# Patient Record
Sex: Female | Born: 1985 | Race: Black or African American | Hispanic: No | Marital: Single | Smoking: Never smoker
Health system: Southern US, Community
[De-identification: ages and names within clinical notes are randomized; demographics above are authoritative.]

## PROBLEM LIST (undated history)

## (undated) DIAGNOSIS — S025XXA Fracture of tooth (traumatic), initial encounter for closed fracture: Secondary | ICD-10-CM

## (undated) DIAGNOSIS — D649 Anemia, unspecified: Secondary | ICD-10-CM

## (undated) HISTORY — PX: THERAPEUTIC ABORTION: SHX798

## (undated) HISTORY — PX: INDUCED ABORTION: SHX677

---
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
## 2008-10-09 ENCOUNTER — Inpatient Hospital Stay (HOSPITAL_COMMUNITY): Admission: EM | Admit: 2008-10-09 | Discharge: 2008-10-10 | Payer: Self-pay | Admitting: Emergency Medicine

## 2009-11-23 ENCOUNTER — Emergency Department (HOSPITAL_COMMUNITY): Admission: EM | Admit: 2009-11-23 | Discharge: 2009-11-23 | Payer: Self-pay | Admitting: Family Medicine

## 2010-01-09 ENCOUNTER — Emergency Department (HOSPITAL_COMMUNITY): Admission: EM | Admit: 2010-01-09 | Discharge: 2010-01-09 | Payer: Self-pay | Admitting: Family Medicine

## 2011-02-06 LAB — POCT URINALYSIS DIP (DEVICE)
Bilirubin Urine: NEGATIVE
Glucose, UA: NEGATIVE mg/dL
Hgb urine dipstick: NEGATIVE
Ketones, ur: NEGATIVE mg/dL
Nitrite: NEGATIVE
Protein, ur: NEGATIVE mg/dL
Specific Gravity, Urine: 1.02 (ref 1.005–1.030)
Urobilinogen, UA: 0.2 mg/dL (ref 0.0–1.0)
pH: 6 (ref 5.0–8.0)

## 2011-02-06 LAB — WET PREP, GENITAL
Trich, Wet Prep: NONE SEEN
Yeast Wet Prep HPF POC: NONE SEEN

## 2011-02-06 LAB — POCT PREGNANCY, URINE: Preg Test, Ur: POSITIVE

## 2011-02-06 LAB — GC/CHLAMYDIA PROBE AMP, GENITAL
Chlamydia, DNA Probe: NEGATIVE
GC Probe Amp, Genital: NEGATIVE

## 2011-04-05 NOTE — Consult Note (Signed)
NAME:  Maria Bowman, Maria Bowman                  ACCOUNT NO.:  [id number]   MEDICAL RECORD NO.:  [id number]          PATIENT TYPE:  INP   LOCATION:  1520                         FACILITY:  [facility]   PHYSICIAN:  Antonietta Breach, M.D.  DATE OF BIRTH:  1986/02/27   DATE OF CONSULTATION:  10/10/2008  DATE OF DISCHARGE:  10/10/2008                                 CONSULTATION   REQUESTING PHYSICIAN:  [facility].   REASON FOR CONSULTATION:  Overdose.   HISTORY OF PRESENT ILLNESS:  Maria Bowman is a 25 year old female admitted  to the [facility] on October 09, 2008 after a Benadryl  overdose in a suicide attempt.   The patient states that she was drinking a lot of vodka and became  severely intoxicated.  She was concerned about gossip in the family  reaching her child.  She realizes that she over reacted.  When she came  into the emergency room she did voice that she was having suicidal  thoughts.  The patient denied any suicidal intent.  She has a lot of  hope and constructive future goals.  She points out her role as a  mother.   The patient does not have any difficulty with memory and orientation.  She is socially appropriate and cooperative.  She does realize that she  has been abusing substances.  Her urine drug screen is positive for THC.   The patient emphasizes that she drank the alcohol and became very  intoxicated prior to the overdose.   PAST PSYCHIATRIC HISTORY:  The patient does have a history of overdosing  when she was 25 years old.  She states at that point she was trying to  get her mother's attention.   FAMILY PSYCHIATRIC HISTORY:  None known.   SOCIAL HISTORY:  The patient has two children.  She has been using  marijuana.  She states that the father of the baby does help her  regularly.   PAST MEDICAL HISTORY:  Status post Benadryl intoxication as well as  alcohol intoxication.   MEDICATIONS:  Zofran p.r.n.  She has NO KNOWN DRUG ALLERGIES.   LABORATORY DATA:   SGOT 17, SGPT 15.  Urine drug screen was positive for  THC.   REVIEW OF SYSTEMS:  Noncontributory.   EXAMINATION:  VITAL SIGNS:  Temperature 98.2, pulse 82, respiratory rate  20, blood pressure 108/62, O2 saturation on room air 100%.   MENTAL STATUS EXAM:  Maria Bowman is alert.  Her attention span is normal.  Her concentration is normal.  Her eye contact is good.  Her affect is  broad and appropriate.  Mood is within normal limits.  She is oriented  to all spheres.  Her memory is intact to immediate, recent and remote.  Her speech involves normal rate and prosody with no dysarthria.  Thought  process is logical, coherent, goal-directed.  No looseness of  associations.  Thought content:  No thoughts of harming herself, no  thoughts of harming others, no delusions, no hallucinations.  Insight is  intact.  Judgment is intact.   ASSESSMENT:  Axis  I:  Adjustment disorder with mixed disturbance of  emotions and conduct, now resolved.  Rule out polysubstance dependence.  Axis II:  Deferred.  Axis III:  See past medical history.  Axis IV:  Primary support group.  Axis V:  Currently 54 after read recovering from intoxication.   Ms. Frede is no longer at risk to harm herself or others.  She agrees to  call emergency services immediately for any thoughts of harming herself,  thoughts of harming others or distress.   The undersigned provided ego support and education.   The undersigned recommended that the patient immediately enter an  inpatient chemical dependency substance rehabilitation program.  However, the patient declined and she is no longer committable after  recovering from her intoxication.  She declines any form of chemical  dependent rehabilitation.  However, if she changes her mind would call  [phone] for access to chemical dependency treatment.   The social worker can provide the patient an outpatient psychiatric  followup after discharge to provide additional reinforcement  to the  patient that she could greatly benefit from formal chemical abuse  relapse prevention skills.  She could also benefit from coping skills  and stress management training with a counselor.  Outpatient psychiatric  followup can be obtained at one of the clinics attached to [facility], [place] or [facility].      Antonietta Breach, M.D.  Electronically Signed     JW/MEDQ  D:  11/02/2008  T:  11/02/2008  Job:  [id number]

## 2011-04-05 NOTE — H&P (Signed)
NAME:  Maria Bowman, Maria Bowman NO.:  [id number]   MEDICAL RECORD NO.:  [id number]          PATIENT TYPE:  INP   LOCATION:  0107                         FACILITY:  [facility]   PHYSICIAN:  Lamar Laundry, MD      DATE OF BIRTH:  [birth year]   DATE OF ADMISSION:  10/09/2008  DATE OF DISCHARGE:                              HISTORY & PHYSICAL   CHIEF COMPLAINT:  Sleeping pill overdose.   HISTORY OF PRESENT ILLNESS:  A 25 year old female with a history of a  prior suicide attempt at age 51, who presents to the ER today after  taking 21 over-the-counter sleeping pills, whose active ingredient was  25 mg of Benadryl; and drinking some vodka and subsequently calling her  best friend to let her know what she had done.  Her best friend came to  her house and noted that she was awake and breathing spontaneously, but  decided to bring her to the emergency room for further evaluation.   Upon arrival, the patient was breathing spontaneously and per the ER  report continued to threaten to kill herself.  Charcoal was administered  as the pills were ingested recently.  The patient vomited, but no pill  fragments came up.  She continued to be alert and breathe spontaneously  while she was in the ER.  She admits that she was in fact trying to hurt  herself and take her own life by doing this.  She does not see anyone  from [facility] or psychiatry on an outpatient basis.   ALLERGIES:  No known drug allergies.   MEDICATIONS:  No medications.   PAST MEDICAL HISTORY:  Previous suicide attempt at age 25, otherwise  negative.   SOCIAL HISTORY:  She is single and has 2 children.  She works as a host  at [employer].  She has smoked marijuana for the last 10 years, but  she states that she has quit this as of 3 days ago.  She states that she  is a social drinker, drinking 4-6 drinks per week.  She denies occasions  where she drinks more than this.  She denies the use of tobacco.   FAMILY  HISTORY:  She denies any history of premature coronary artery  disease or mental health issues in her family.   REVIEW OF SYSTEMS:  A 10-point review of systems was performed and is  negative.  Other than the significant frustration that she has felt all  day today, leading up to her overdose.   PHYSICAL EXAMINATION:  VITAL SIGNS:  Temperature 98.2, blood pressure  128/80, pulse 100, respirations 18, saturations 100% on  room air.  GENERAL:  This is a young [place] female in no acute distress.  HEENT:  Normocephalic and atraumatic.  Extraocular movements intact.  Moist mucous membranes.  NECK:  No JVD.  LUNGS:  Clear to auscultation bilaterally.  No crackles, wheezes or  rhonchi.  CARDIOVASCULAR:  Tachycardic.  Regular rhythm.  No murmurs, rubs or  gallops.  ABDOMEN:  Soft, nontender and nondistended.  Normal active bowel sounds.  EXTREMITIES:  No pedal edema.  SKIN:  No bruises.  NEUROLOGIC:  Awake, alert and oriented x3.  No focal motor or sensory  deficits.   LABS:  Chem Panel:  Sodium 143, potassium 2.7, chloride 109, bicarb 24,  BUN 9, creatinine 0.8, glucose 63.  Calcium 9.5, albumin 4.3.  LFTs  within normal limits.  Pregnancy test was negative.  Alcohol level was  elevated at 16.  Tylenol level was less than 10.  Urinalysis was  negative.  Chest x-ray does not reveal any acute findings.  An EKG  reveals the patient to be in normal sinus rhythm without any ST or T-  wave abnormalities; with a prolonged QT interval (the corrected QT  interval being 487 mLs).   ASSESSMENT AND PLAN:  1. This is a 25 year old with a sleeping pill overdose in a suicide      attempt.  We will admit the patient to the hospital.  Monitor her      on telemetry and check and EKG in the a.m.  We will provide her      with gentle IV fluids.  We will have a sitter at all times.      Behavioral Health and psychiatry will see the patient in the      morning.  We will also check a urine toxicity  screen, as this was      not yet completed.  2. Hypokalemia:  This is likely secondary to the emesis induced by      charcoal.  We will provide the patient with oral potassium and      potassium in her IV fluids.  Will recheck the values in the      morning.  3. Prolonged QT interval:  We will keep the patient on telemetry and      recheck an EKG in the morning.  4. Prophylaxis:  We will place the patient on SCDs.      Lamar Laundry, MD  Electronically Signed     HR/MEDQ  D:  10/09/2008  T:  10/09/2008  Job:  [id number]

## 2011-04-08 NOTE — Discharge Summary (Signed)
NAME:  Maria Bowman, Maria Bowman                  ACCOUNT NO.:  [id number]   MEDICAL RECORD NO.:  [id number]          PATIENT TYPE:  INP   LOCATION:  1520                         FACILITY:  [facility]   PHYSICIAN:  Hillery Aldo, M.D.   DATE OF BIRTH:  May 11, 1986   DATE OF ADMISSION:  10/09/2008  DATE OF DISCHARGE:  10/10/2008                               DISCHARGE SUMMARY   PRIMARY CARE PHYSICIAN:  Unassigned.   DISCHARGE DIAGNOSES:  1. Benadryl overdose.  2. Suicide attempt.  3. Cannabis abuse.  4. Hypokalemia.  5. Normocytic anemia.  6. Alcohol abuse.  7. Polysubstance abuse.  8. Adjustment reaction not otherwise specified.   DISCHARGE MEDICATIONS:  None.   CONSULTATIONS:  Dr. Jeanie Sewer of psychiatry.   BRIEF ADMISSION HISTORY:  The patient is a 25 year old female who  presented to the hospital after ingesting 21 Benadryl tablets, drinking  vodka, and then subsequently calling her best friend to inform her of  her intentional overdose.  She was brought to the emergency department  accompanied by her friend for further evaluation.  For the full details,  please see the dictated report done by Dr. Ancil Boozer.   PROCEDURES AND DIAGNOSTIC STUDIES:  Chest x-ray on October 09, 2008  showed no acute abnormalities.   DISCHARGE LABORATORY VALUES:  Magnesium was 2.3.  Sodium was 141,  potassium 3.6, chloride 114, bicarb 24, BUN 10, creatinine 0.1, glucose  85.  White blood cell count was 5.1, hemoglobin 10.8, hematocrit 31.8,  platelets 182.  Urine drug screen was positive for tetrahydrocannabinol.   HOSPITAL COURSE BY PROBLEM:  1. Intentional overdose/suicide attempt:  The patient was admitted and      monitored closely for hemodynamic stability.  She was seen in      consultation with Dr. Jeanie Sewer of psychiatry, and was not felt to      be committable.  She declined inpatient psychiatric hospitalization      or aftercare for chemical dependency.  Nevertheless, the patient      was encouraged to  follow up with [facility] or      [facility].  The telephone numbers were provided      for the patient at discharge.  2. Hypokalemia:  The patient was appropriately repleted.  3. Mild normocytic anemia likely due to menstrual cycle.  No further      diagnostic workup was undertaken.  4. Polysubstance abuse:  The patient was counseled on the importance      of cessation, but declined further chemical dependency treatment.   DISPOSITION:  The patient was discharged home after being cleared by the  psychiatrist.  Again, she was instructed to follow up with mental health  services.   Time spent coordinating care for discharge and discharge instructions  equals 35 minutes.      Hillery Aldo, M.D.  Electronically Signed     CR/MEDQ  D:  10/24/2008  T:  10/24/2008  Job:  [id number]

## 2011-08-24 LAB — CBC
HCT: 37.1
Hemoglobin: 10.8 — ABNORMAL LOW
MCHC: 32.6
MCV: 85.5
RBC: 3.72 — ABNORMAL LOW
RBC: 4.34
RDW: 13.5
WBC: 5.1
WBC: 7

## 2011-08-24 LAB — RAPID URINE DRUG SCREEN, HOSP PERFORMED
Amphetamines: NOT DETECTED
Benzodiazepines: NOT DETECTED
Tetrahydrocannabinol: POSITIVE — AB

## 2011-08-24 LAB — COMPREHENSIVE METABOLIC PANEL
ALT: 15
AST: 17
Albumin: 4.3
CO2: 24
Chloride: 109
Creatinine, Ser: 0.84
GFR calc Af Amer: >60
GFR calc non Af Amer: >60
Sodium: 143
Total Bilirubin: 0.7

## 2011-08-24 LAB — URINALYSIS, ROUTINE W REFLEX MICROSCOPIC
Bilirubin Urine: NEGATIVE
Hgb urine dipstick: NEGATIVE
Ketones, ur: NEGATIVE
Specific Gravity, Urine: 1.006
Urobilinogen, UA: 1
pH: 6.5

## 2011-08-24 LAB — ETHANOL: Alcohol, Ethyl (B): 16 — ABNORMAL HIGH

## 2011-08-24 LAB — BASIC METABOLIC PANEL
Calcium: 8.8
GFR calc Af Amer: >60
GFR calc non Af Amer: >60
Potassium: 3.6
Sodium: 141

## 2013-11-25 ENCOUNTER — Encounter (HOSPITAL_COMMUNITY): Payer: Self-pay | Admitting: Emergency Medicine

## 2013-11-25 ENCOUNTER — Emergency Department (HOSPITAL_COMMUNITY)
Admission: EM | Admit: 2013-11-25 | Discharge: 2013-11-25 | Disposition: A | Discharge status: Home / Self Care | Payer: Medicaid Other | Source: Home / Self Care | Attending: Emergency Medicine | Admitting: Emergency Medicine

## 2013-11-25 DIAGNOSIS — N3000 Acute cystitis without hematuria: Secondary | ICD-10-CM

## 2013-11-25 LAB — POCT URINALYSIS DIP (DEVICE)
Bilirubin Urine: NEGATIVE
Glucose, UA: NEGATIVE mg/dL
Ketones, ur: 15 mg/dL — AB
NITRITE: POSITIVE — AB
PH: 6 (ref 5.0–8.0)
Specific Gravity, Urine: >1.03 — ABNORMAL HIGH (ref 1.005–1.030)
Urobilinogen, UA: 0.2 mg/dL (ref 0.0–1.0)

## 2013-11-25 LAB — POCT PREGNANCY, URINE: Preg Test, Ur: NEGATIVE

## 2013-11-25 MED ORDER — CEPHALEXIN 500 MG PO CAPS: 500.0000 mg | ORAL_CAPSULE | Freq: Three times a day (TID) | ORAL | Status: DC

## 2013-11-25 MED ORDER — PHENAZOPYRIDINE HCL 200 MG PO TABS: 200.0000 mg | ORAL_TABLET | Freq: Three times a day (TID) | ORAL | Status: DC | PRN

## 2013-11-25 NOTE — Discharge Instructions (Signed)
Urinary Tract Infection °Urinary tract infections (UTIs) can develop anywhere along your urinary tract. Your urinary tract is your body's drainage system for removing wastes and extra water. Your urinary tract includes two kidneys, two ureters, a bladder, and a urethra. Your kidneys are a pair of bean-shaped organs. Each kidney is about the size of your fist. They are located below your ribs, one on each side of your spine. °CAUSES °Infections are caused by microbes, which are microscopic organisms, including fungi, viruses, and bacteria. These organisms are so small that they can only be seen through a microscope. Bacteria are the microbes that most commonly cause UTIs. °SYMPTOMS  °Symptoms of UTIs may vary by age and gender of the patient and by the location of the infection. Symptoms in young women typically include a frequent and intense urge to urinate and a painful, burning feeling in the bladder or urethra during urination. Older women and men are more likely to be tired, shaky, and weak and have muscle aches and abdominal pain. A fever may mean the infection is in your kidneys. Other symptoms of a kidney infection include pain in your back or sides below the ribs, nausea, and vomiting. °DIAGNOSIS °To diagnose a UTI, your caregiver will ask you about your symptoms. Your caregiver also will ask to provide a urine sample. The urine sample will be tested for bacteria and white blood cells. White blood cells are made by your body to help fight infection. °TREATMENT  °Typically, UTIs can be treated with medication. Because most UTIs are caused by a bacterial infection, they usually can be treated with the use of antibiotics. The choice of antibiotic and length of treatment depend on your symptoms and the type of bacteria causing your infection. °HOME CARE INSTRUCTIONS °· If you were prescribed antibiotics, take them exactly as your caregiver instructs you. Finish the medication even if you feel better after you  have only taken some of the medication. °· Drink enough water and fluids to keep your urine clear or pale yellow. °· Avoid caffeine, tea, and carbonated beverages. They tend to irritate your bladder. °· Empty your bladder often. Avoid holding urine for long periods of time. °· Empty your bladder before and after sexual intercourse. °· After a bowel movement, women should cleanse from front to back. Use each tissue only once. °SEEK MEDICAL CARE IF:  °· You have back pain. °· You develop a fever. °· Your symptoms do not begin to resolve within 3 days. °SEEK IMMEDIATE MEDICAL CARE IF:  °· You have severe back pain or lower abdominal pain. °· You develop chills. °· You have nausea or vomiting. °· You have continued burning or discomfort with urination. °MAKE SURE YOU:  °· Understand these instructions. °· Will watch your condition. °· Will get help right away if you are not doing well or get worse. °Document Released: 08/17/2005 Document Revised: 05/08/2012 Document Reviewed: 12/16/2011 °ExitCare® Patient Information ©2014 [employer]. ° °To restore the normal balance of "good bacteria" in your system.  Take a probiotic once daily.  These can be gotten over the counter at the drug store without a prescription and come under various brand names such as Culturelle, Align, Florastore, and Phillips.  The best thing to do is to ask your pharmacist to recommend a good probiotic that is not too expensive.  ° °

## 2013-11-25 NOTE — ED Provider Notes (Signed)
Chief Complaint:   Chief Complaint  Patient presents with  . Abdominal Pain    History of Present Illness:   Maria Bowman is a 28 year old female who has a two-day history of dysuria, frequency, and malodorous urine. She's had slight suprapubic discomfort. She denies fever, chills, nausea, vomiting, lower back or CVA pain, upper abdominal pain, hematuria, or GYN complaints. She has never had a urinary tract infection before. She cannot think of any obvious precipitating factor.  Review of Systems:  Other than noted above, the patient denies any of the following symptoms: General:  No fevers, chills, sweats, aches, or fatigue. GI:  No abdominal pain, back pain, nausea, vomiting, diarrhea, or constipation. GU:  No dysuria, frequency, urgency, hematuria, or incontinence. GYN:  No discharge, itching, vulvar pain or lesions, pelvic pain, or abnormal vaginal bleeding.  PMFSH:  Past medical history, family history, social history, meds, and allergies were reviewed.   Physical Exam:   Vital signs:  BP 122/79  Pulse 79  Temp(Src) 99.2 F (37.3 C) (Oral)  Resp 16  SpO2 100% Gen:  Alert, oriented, in no distress. Lungs:  Clear to auscultation, no wheezes, rales or rhonchi. Heart:  Regular rhythm, no gallop or murmer. Abdomen:  Flat and soft. There was slight suprapubic pain to palpation.  No guarding, or rebound.  No hepato-splenomegaly or mass.  Bowel sounds were normally active.  No hernia. Back:  No CVA tenderness.  Skin:  Clear, warm and dry.  Labs:    Results for orders placed during the hospital encounter of 11/25/13  POCT PREGNANCY, URINE      Result Value Range   Preg Test, Ur NEGATIVE  NEGATIVE  POCT URINALYSIS DIP (DEVICE)      Result Value Range   Glucose, UA NEGATIVE  NEGATIVE mg/dL   Bilirubin Urine NEGATIVE  NEGATIVE   Ketones, ur 15 (*) NEGATIVE mg/dL   Specific Gravity, Urine >1.030 (*) 1.005 - 1.030   Hgb urine dipstick LARGE (*) NEGATIVE   pH 6.0  5.0 - 8.0   Protein, ur >300 (*) NEGATIVE mg/dL   Urobilinogen, UA 0.2  0.0 - 1.0 mg/dL   Nitrite POSITIVE (*) NEGATIVE   Leukocytes, UA TRACE (*) NEGATIVE     A urine culture was obtained.  Results are pending at this time and we will call about any positive results.  Assessment: The encounter diagnosis was Acute cystitis.   No evidence of pyelonephritis.  Plan:   1.  Meds:  The following meds were prescribed:   Discharge Medication List as of 11/25/2013  1:10 PM    START taking these medications   Details  cephALEXin (KEFLEX) 500 MG capsule Take 1 capsule (500 mg total) by mouth 3 (three) times daily., Starting 11/25/2013, Until Discontinued, Normal    phenazopyridine (PYRIDIUM) 200 MG tablet Take 1 tablet (200 mg total) by mouth 3 (three) times daily as needed for pain., Starting 11/25/2013, Until Discontinued, Normal        2.  Patient Education/Counseling:  The patient was given appropriate handouts, self care instructions, and instructed in symptomatic relief. The patient was told to avoid intercourse for 10 days, get extra fluids, and return for a follow up with her primary care doctor at the completion of treatment for a repeat UA and culture.   3.  Follow up:  The patient was told to follow up if no better in 3 to 4 days, if becoming worse in any way, and given some red flag symptoms  such as fever, back pain, or persistent vomiting which would prompt immediate return.  Follow up here or at the emergency room as needed.     Reuben Likes, MD 11/25/13 [phone]

## 2013-11-25 NOTE — ED Notes (Signed)
Onset lower abdominal area pain last PM. Pain reportedly worse at end of UA stream; LMP onset yesterday PM

## 2015-05-02 ENCOUNTER — Encounter (HOSPITAL_COMMUNITY): Payer: Self-pay | Admitting: Emergency Medicine

## 2015-05-02 ENCOUNTER — Emergency Department (HOSPITAL_COMMUNITY)
Admission: EM | Admit: 2015-05-02 | Discharge: 2015-05-02 | Disposition: A | Discharge status: Home / Self Care | Payer: Medicaid Other | Attending: Emergency Medicine | Admitting: Emergency Medicine

## 2015-05-02 DIAGNOSIS — Y998 Other external cause status: Secondary | ICD-10-CM | POA: Diagnosis not present

## 2015-05-02 DIAGNOSIS — Z792 Long term (current) use of antibiotics: Secondary | ICD-10-CM | POA: Diagnosis not present

## 2015-05-02 DIAGNOSIS — Z23 Encounter for immunization: Secondary | ICD-10-CM | POA: Insufficient documentation

## 2015-05-02 DIAGNOSIS — Y929 Unspecified place or not applicable: Secondary | ICD-10-CM | POA: Insufficient documentation

## 2015-05-02 DIAGNOSIS — S91332A Puncture wound without foreign body, left foot, initial encounter: Secondary | ICD-10-CM | POA: Diagnosis not present

## 2015-05-02 DIAGNOSIS — S99922A Unspecified injury of left foot, initial encounter: Secondary | ICD-10-CM | POA: Diagnosis present

## 2015-05-02 DIAGNOSIS — Y288XXA Contact with other sharp object, undetermined intent, initial encounter: Secondary | ICD-10-CM | POA: Insufficient documentation

## 2015-05-02 DIAGNOSIS — Y9389 Activity, other specified: Secondary | ICD-10-CM | POA: Diagnosis not present

## 2015-05-02 MED ORDER — CIPROFLOXACIN HCL 500 MG PO TABS: 500.0000 mg | ORAL_TABLET | Freq: Two times a day (BID) | ORAL | Status: DC

## 2015-05-02 MED ORDER — CIPROFLOXACIN HCL 500 MG PO TABS
500.0000 mg | ORAL_TABLET | Freq: Once | ORAL | Status: AC
  Administered 2015-05-02: 500 mg via ORAL
  Filled 2015-05-02: qty 1

## 2015-05-02 MED ORDER — TETANUS-DIPHTH-ACELL PERTUSSIS 5-2.5-18.5 LF-MCG/0.5 IM SUSP
0.5000 mL | Freq: Once | INTRAMUSCULAR | Status: AC
  Administered 2015-05-02: 0.5 mL via INTRAMUSCULAR
  Filled 2015-05-02: qty 0.5

## 2015-05-02 NOTE — ED Provider Notes (Signed)
CSN: [id number]     Arrival date & time 05/02/15  2033 History  This chart was scribed for non-physician practitioner, Elpidio Anis, PA-C,working with Nelva Nay, MD, by Karle Plumber, ED Scribe. This patient was seen in room TR11C/TR11C and the patient's care was started at 9:14 PM.  Chief Complaint  Patient presents with  . Foot Injury    The patient said she stepped on a rusty piece of metal and it punctured the bottom of her left foot.    The history is provided by the patient and medical records. No language interpreter was used.    HPI Comments:  Maria Bowman is a 29 y.o. female who presents to the Emergency Department complaining of a puncture wound to the plantar surface of the left foot that occurred approximately 5 hours ago. She states she stepped on a rusty, metal object, possibly a nail, while wearing a shoe. The object penetrated through the shoe into the skin. She reports taking Ibuprofen prior to arrival. Walking on the foot makes the pain worse. Denies alleviating factors. Denies numbness, tingling or weakness of the left foot, fever, chills, nausea, vomiting, warmth, drainage or red streaking.  History reviewed. No pertinent past medical history. History reviewed. No pertinent past surgical history. History reviewed. No pertinent family history. History  Substance Use Topics  . Smoking status: Never Smoker   . Smokeless tobacco: Not on file  . Alcohol Use: Yes   OB History    No data available     Review of Systems  Constitutional: Negative for fever and chills.  Gastrointestinal: Negative for nausea and vomiting.  Skin: Positive for wound.  Neurological: Negative for weakness and numbness.    Allergies  Review of patient's allergies indicates not on file.  Home Medications   Prior to Admission medications   Medication Sig Start Date End Date Taking? Authorizing Provider  cephALEXin (KEFLEX) 500 MG capsule Take 1 capsule (500 mg total) by mouth 3  (three) times daily. 11/25/13   Reuben Likes, MD  phenazopyridine (PYRIDIUM) 200 MG tablet Take 1 tablet (200 mg total) by mouth 3 (three) times daily as needed for pain. 11/25/13   Reuben Likes, MD   Triage Vitals: BP 107/67 mmHg  Pulse 91  Temp(Src) 98.7 F (37.1 C) (Oral)  Resp 16  SpO2 98%  LMP 05/02/2015 (LMP Unknown) Physical Exam  Constitutional: She is oriented to person, place, and time. She appears well-developed and well-nourished.  HENT:  Head: Normocephalic and atraumatic.  Eyes: EOM are normal.  Neck: Normal range of motion.  Cardiovascular: Normal rate.   Pulmonary/Chest: Effort normal.  Musculoskeletal: Normal range of motion.  Neurological: She is alert and oriented to person, place, and time.  Skin: Skin is warm and dry.  Left foot with small puncture wound to plantar surface with surrounding swelling. No erythema. No palpable foreign body.  Psychiatric: She has a normal mood and affect. Her behavior is normal.  Nursing note and vitals reviewed.   ED Course  Procedures (including critical care time) DIAGNOSTIC STUDIES: Oxygen Saturation is 98% on RA, normal by my interpretation.   COORDINATION OF CARE: 9:17 PM- Will update tetanus vaccination and prescribe antibiotics. Pt verbalizes understanding and agrees to plan.  Medications - No data to display  Labs Review Labs Reviewed - No data to display  Imaging Review No results found.   EKG Interpretation None      MDM   Final diagnoses:  None    1.  Puncture wound, left foot  No suspected retained FB. Antibiotics started, wound care instructions provided.  I personally performed the services described in this documentation, which was scribed in my presence. The recorded information has been reviewed and is accurate.    Elpidio Anis, PA-C 05/02/15 2208  Nelva Nay, MD 05/02/15 2252

## 2015-05-02 NOTE — Discharge Instructions (Signed)
Puncture Wound °A puncture wound is an injury that extends through all layers of the skin and into the tissue beneath the skin (subcutaneous tissue). Puncture wounds become infected easily because germs often enter the body and go beneath the skin during the injury. Having a deep wound with a small entrance point makes it difficult for your caregiver to adequately clean the wound. This is especially true if you have stepped on a nail and it has passed through a dirty shoe or other situations where the wound is obviously contaminated. °CAUSES  °Many puncture wounds involve glass, nails, splinters, fish hooks, or other objects that enter the skin (foreign bodies). A puncture wound may also be caused by a human bite or animal bite. °DIAGNOSIS  °A puncture wound is usually diagnosed by your history and a physical exam. You may need to have an X-ray or an ultrasound to check for any foreign bodies still in the wound. °TREATMENT  °· Your caregiver will clean the wound as thoroughly as possible. Depending on the location of the wound, a bandage (dressing) may be applied. °· Your caregiver might prescribe antibiotic medicines. °· You may need a follow-up visit to check on your wound. Follow all instructions as directed by your caregiver. °HOME CARE INSTRUCTIONS  °· Change your dressing once per day, or as directed by your caregiver. If the dressing sticks, it may be removed by soaking the area in water. °· If your caregiver has given you follow-up instructions, it is very important that you return for a follow-up appointment. Not following up as directed could result in a chronic or permanent injury, pain, and disability. °· Only take over-the-counter or prescription medicines for pain, discomfort, or fever as directed by your caregiver. °· If you are given antibiotics, take them as directed. Finish them even if you start to feel better. °You may need a tetanus shot if: °· You cannot remember when you had your last tetanus  shot. °· You have never had a tetanus shot. °If you got a tetanus shot, your arm may swell, get red, and feel warm to the touch. This is common and not a problem. If you need a tetanus shot and you choose not to have one, there is a rare chance of getting tetanus. Sickness from tetanus can be serious. °You may need a rabies shot if an animal bite caused your puncture wound. °SEEK MEDICAL CARE IF:  °· You have redness, swelling, or increasing pain in the wound. °· You have red streaks going away from the wound. °· You notice a bad smell coming from the wound or dressing. °· You have yellowish-white fluid (pus) coming from the wound. °· You are treated with an antibiotic for infection, but the infection is not getting better. °· You notice something in the wound, such as rubber from your shoe, cloth, or another object. °· You have a fever. °· You have severe pain. °· You have difficulty breathing. °· You feel dizzy or faint. °· You cannot stop vomiting. °· You lose feeling, develop numbness, or cannot move a limb below the wound. °· Your symptoms worsen. °MAKE SURE YOU: °· Understand these instructions. °· Will watch your condition. °· Will get help right away if you are not doing well or get worse. °Document Released: 08/17/2005 Document Revised: 01/30/2012 Document Reviewed: 04/26/2011 °ExitCare® Patient Information ©2015 [employer]. This information is not intended to replace advice given to you by your health care provider. Make sure you discuss any questions you   have with your health care provider. ° °

## 2015-05-02 NOTE — ED Notes (Signed)
The patient said she stepped on a rusty piece of metal and it punctured the bottom of her left foot.   The patient said she cleaned it really good and put some bacitracin.  She is here to have it looked at.  Her foot is red and swollen.  Her pain is 8/10.

## 2017-11-06 ENCOUNTER — Encounter (HOSPITAL_COMMUNITY): Payer: Self-pay | Admitting: Emergency Medicine

## 2017-11-06 ENCOUNTER — Ambulatory Visit (HOSPITAL_COMMUNITY)
Admission: EM | Admit: 2017-11-06 | Discharge: 2017-11-06 | Disposition: A | Discharge status: Home / Self Care | Payer: Medicaid Other | Attending: Internal Medicine | Admitting: Internal Medicine

## 2017-11-06 DIAGNOSIS — Z79899 Other long term (current) drug therapy: Secondary | ICD-10-CM | POA: Diagnosis not present

## 2017-11-06 DIAGNOSIS — A599 Trichomoniasis, unspecified: Secondary | ICD-10-CM

## 2017-11-06 LAB — POCT URINALYSIS DIP (DEVICE)
Bilirubin Urine: NEGATIVE
Glucose, UA: NEGATIVE mg/dL
HGB URINE DIPSTICK: NEGATIVE
Ketones, ur: 15 mg/dL — AB
NITRITE: NEGATIVE
PH: 6 (ref 5.0–8.0)
Protein, ur: NEGATIVE mg/dL
Specific Gravity, Urine: >=1.03 (ref 1.005–1.030)
Urobilinogen, UA: 0.2 mg/dL (ref 0.0–1.0)

## 2017-11-06 MED ORDER — METRONIDAZOLE 500 MG PO TABS: ORAL_TABLET | ORAL | 0 refills | Status: DC

## 2017-11-06 NOTE — Discharge Instructions (Signed)
Follow up at the health department if symptoms persist

## 2017-11-06 NOTE — ED Provider Notes (Signed)
[facility]    CSN: [id number] Arrival date & time: 11/06/17  [id number]     History   Chief Complaint Chief Complaint  Patient presents with  . Exposure to STD    HPI Maria Bowman is a 31 y.o. female.   The history is provided by the patient. No language interpreter was used.  Exposure to STD  This is a recurrent problem. The problem occurs constantly. The problem has been gradually worsening. Nothing aggravates the symptoms. Nothing relieves the symptoms. She has tried nothing for the symptoms. The treatment provided no relief.  Pt reports she has trichomonas.  Pt reports she was treated in October but was re exposed by partner.  Pt states [facility] Pt had negative gc/ct and hiv.  Pt reports same odor as with previous infections.   History reviewed. No pertinent past medical history.  There are no active problems to display for this patient.   History reviewed. No pertinent surgical history.  OB History    No data available       Home Medications    Prior to Admission medications   Medication Sig Start Date End Date Taking? Authorizing Provider  cephALEXin (KEFLEX) 500 MG capsule Take 1 capsule (500 mg total) by mouth 3 (three) times daily. 11/25/13   Reuben LikesKeller, David C, MD  ciprofloxacin (CIPRO) 500 MG tablet Take 1 tablet (500 mg total) by mouth 2 (two) times daily. 05/02/15   Elpidio AnisUpstill, Shari, PA-C  metroNIDAZOLE (FLAGYL) 500 MG tablet Take 4 tablets at once. 11/06/17   Elson AreasSofia, Yunus Stoklosa K, PA-C  phenazopyridine (PYRIDIUM) 200 MG tablet Take 1 tablet (200 mg total) by mouth 3 (three) times daily as needed for pain. 11/25/13   Reuben LikesKeller, David C, MD    Family History History reviewed. No pertinent family history.  Social History Social History   Tobacco Use  . Smoking status: Never Smoker  Substance Use Topics  . Alcohol use: Yes  . Drug use: Not on file     Allergies   Patient has no known allergies.   Review of  Systems Review of Systems  All other systems reviewed and are negative.    Physical Exam Triage Vital Signs ED Triage Vitals  Enc Vitals Group     BP 11/06/17 1923 125/79     Pulse Rate 11/06/17 1923 (!) 56     Resp 11/06/17 1923 18     Temp 11/06/17 1923 98.5 F (36.9 C)     Temp Source 11/06/17 1923 Oral     SpO2 11/06/17 1923 100 %     Weight --      Height --      Head Circumference --      Peak Flow --      Pain Score 11/06/17 1924 0     Pain Loc --      Pain Edu? --      Excl. in GC? --    No data found.  Updated Vital Signs BP 125/79 (BP Location: Left Arm)   Pulse (!) 56   Temp 98.5 F (36.9 C) (Oral)   Resp 18   SpO2 100%   Visual Acuity Right Eye Distance:   Left Eye Distance:   Bilateral Distance:    Right Eye Near:   Left Eye Near:    Bilateral Near:     Physical Exam  Constitutional: She is oriented to person, place, and time. She appears well-developed and  well-nourished.  HENT:  Head: Normocephalic.  Eyes: EOM are normal.  Neck: Normal range of motion.  Pulmonary/Chest: Effort normal.  Abdominal: She exhibits no distension.  Musculoskeletal: Normal range of motion.  Neurological: She is alert and oriented to person, place, and time.  Psychiatric: She has a normal mood and affect.  Nursing note and vitals reviewed.    UC Treatments / Results  Labs (all labs ordered are listed, but only abnormal results are displayed) Labs Reviewed  POCT URINALYSIS DIP (DEVICE) - Abnormal; Notable for the following components:      Result Value   Ketones, ur 15 (*)    Leukocytes, UA MODERATE (*)    All other components within normal limits  URINE CYTOLOGY ANCILLARY ONLY    EKG  EKG Interpretation None       Radiology No results found.  Procedures Procedures (including critical care time)  Medications Ordered in UC Medications - No data to display   Initial Impression / Assessment and Plan / UC Course  I have reviewed the triage  vital signs and the nursing notes.  Pertinent labs & imaging results that were available during my care of the patient were reviewed by me and considered in my medical decision making (see chart for details).       Final Clinical Impressions(s) / UC Diagnoses   Final diagnoses:  Trichomonas infection    ED Discharge Orders        Ordered    metroNIDAZOLE (FLAGYL) 500 MG tablet     11/06/17 2010       Controlled Substance Prescriptions [place] Controlled Substance Registry consulted? Not Applicable  An After Visit Summary was printed and given to the patient.    Elson AreasSofia, Brailen Macneal K, [place]PA-C 11/06/17 2026

## 2017-11-06 NOTE — ED Triage Notes (Signed)
Pt sts vaginal discharge with odor 

## 2017-11-08 LAB — URINE CYTOLOGY ANCILLARY ONLY
CHLAMYDIA, DNA PROBE: NEGATIVE
Neisseria Gonorrhea: NEGATIVE
Trichomonas: POSITIVE — AB

## 2017-11-09 LAB — URINE CYTOLOGY ANCILLARY ONLY

## 2018-03-26 ENCOUNTER — Encounter (HOSPITAL_COMMUNITY): Payer: Self-pay | Admitting: *Deleted

## 2018-03-26 ENCOUNTER — Inpatient Hospital Stay (HOSPITAL_COMMUNITY)
Admission: AD | Admit: 2018-03-26 | Discharge: 2018-03-26 | Disposition: A | Discharge status: Home / Self Care | Payer: Medicaid Other | Source: Ambulatory Visit | Attending: Obstetrics & Gynecology | Admitting: Obstetrics & Gynecology

## 2018-03-26 ENCOUNTER — Inpatient Hospital Stay (HOSPITAL_COMMUNITY): Payer: Medicaid Other

## 2018-03-26 ENCOUNTER — Other Ambulatory Visit: Payer: Self-pay

## 2018-03-26 DIAGNOSIS — Z3A09 9 weeks gestation of pregnancy: Secondary | ICD-10-CM | POA: Diagnosis not present

## 2018-03-26 DIAGNOSIS — O3680X0 Pregnancy with inconclusive fetal viability, not applicable or unspecified: Secondary | ICD-10-CM

## 2018-03-26 DIAGNOSIS — T7840XA Allergy, unspecified, initial encounter: Secondary | ICD-10-CM

## 2018-03-26 DIAGNOSIS — O209 Hemorrhage in early pregnancy, unspecified: Secondary | ICD-10-CM | POA: Diagnosis not present

## 2018-03-26 LAB — CBC
HEMATOCRIT: 31.4 % — AB (ref 36.0–46.0)
HEMOGLOBIN: 10.4 g/dL — AB (ref 12.0–15.0)
MCH: 24.1 pg — AB (ref 26.0–34.0)
MCHC: 33.1 g/dL (ref 30.0–36.0)
MCV: 72.7 fL — AB (ref 78.0–100.0)
Platelets: 239 10*3/uL (ref 150–400)
RBC: 4.32 MIL/uL (ref 3.87–5.11)
RDW: 18.8 % — ABNORMAL HIGH (ref 11.5–15.5)
WBC: 4.6 10*3/uL (ref 4.0–10.5)

## 2018-03-26 LAB — POCT PREGNANCY, URINE: Preg Test, Ur: POSITIVE — AB

## 2018-03-26 LAB — WET PREP, GENITAL
CLUE CELLS WET PREP: NONE SEEN
SPERM: NONE SEEN
TRICH WET PREP: NONE SEEN
Yeast Wet Prep HPF POC: NONE SEEN

## 2018-03-26 LAB — ABO/RH: ABO/RH(D): O NEG

## 2018-03-26 LAB — URINALYSIS, ROUTINE W REFLEX MICROSCOPIC
Bacteria, UA: NONE SEEN
Bilirubin Urine: NEGATIVE
GLUCOSE, UA: NEGATIVE mg/dL
Ketones, ur: 20 mg/dL — AB
Leukocytes, UA: NEGATIVE
NITRITE: NEGATIVE
PH: 6 (ref 5.0–8.0)
PROTEIN: NEGATIVE mg/dL
SPECIFIC GRAVITY, URINE: 1.026 (ref 1.005–1.030)

## 2018-03-26 LAB — HCG, QUANTITATIVE, PREGNANCY: HCG, BETA CHAIN, QUANT, S: 38868 m[IU]/mL — AB (ref <5)

## 2018-03-26 NOTE — Discharge Instructions (Signed)
Dilation and Evacuation scheduled at 2pm 03/27/2018. Come to hospital at 12:30 pm.   Nothing to eat or drink after 6 am tomorrow morning (8 hours before surgery).   Dilation and Curettage or Vacuum Curettage Dilation and curettage (D&C) and vacuum curettage are minor procedures. A D&C involves stretching (dilation) the cervix and scraping (curettage) the inside lining of the uterus (endometrium). During a D&C, tissue is gently scraped from the endometrium, starting from the top portion of the uterus down to the lowest part of the uterus (cervix). During a vacuum curettage, the lining and tissue in the uterus are removed with the use of gentle suction. Curettage may be performed to either diagnose or treat a problem. As a diagnostic procedure, curettage is performed to examine tissues from the uterus. A diagnostic curettage may be done if you have:  Irregular bleeding in the uterus.  Bleeding with the development of clots.  Spotting between menstrual periods.  Prolonged menstrual periods or other abnormal bleeding.  Bleeding after menopause.  No menstrual period (amenorrhea).  A change in size and shape of the uterus.  Abnormal endometrial cells discovered during a Pap test.  As a treatment procedure, curettage may be performed for the following reasons:  Removal of an IUD (intrauterine device).  Removal of retained placenta after giving birth.  Abortion.  Miscarriage.  Removal of endometrial polyps.  Removal of uncommon types of noncancerous lumps (fibroids).  Tell a health care provider about:  Any allergies you have, including allergies to prescribed medicine or latex.  All medicines you are taking, including vitamins, herbs, eye drops, creams, and over-the-counter medicines. This is especially important if you take any blood-thinning medicine. Bring a list of all of your medicines to your appointment.  Any problems you or family members have had with anesthetic  medicines.  Any blood disorders you have.  Any surgeries you have had.  Your medical history and any medical conditions you have.  Whether you are pregnant or may be pregnant.  Recent vaginal infections you have had.  Recent menstrual periods, bleeding problems you have had, and what form of birth control (contraception) you use. What are the risks? Generally, this is a safe procedure. However, problems may occur, including:  Infection.  Heavy vaginal bleeding.  Allergic reactions to medicines.  Damage to the cervix or other structures or organs.  Development of scar tissue (adhesions) inside the uterus, which can cause abnormal amounts of menstrual bleeding. This may make it harder to get pregnant in the future.  A hole (perforation) or puncture in the uterine wall. This is rare.  What happens before the procedure? Staying hydrated Follow instructions from your health care provider about hydration, which may include:  Up to 2 hours before the procedure - you may continue to drink clear liquids, such as water, clear fruit juice, black coffee, and plain tea.  Eating and drinking restrictions Follow instructions from your health care provider about eating and drinking, which may include:  8 hours before the procedure - stop eating heavy meals or foods such as meat, fried foods, or fatty foods.  6 hours before the procedure - stop eating light meals or foods, such as toast or cereal.  6 hours before the procedure - stop drinking milk or drinks that contain milk.  2 hours before the procedure - stop drinking clear liquids. If your health care provider told you to take your medicine(s) on the day of your procedure, take them with only a sip of water.  Medicines  Ask your health care provider about: ? Changing or stopping your regular medicines. This is especially important if you are taking diabetes medicines or blood thinners. ? Taking medicines such as aspirin and  ibuprofen. These medicines can thin your blood. Do not take these medicines before your procedure if your health care provider instructs you not to.  You may be given antibiotic medicine to help prevent infection. General instructions  For 24 hours before your procedure, do not: ? Douche. ? Use tampons. ? Use medicines, creams, or suppositories in the vagina. ? Have sexual intercourse.  You may be given a pregnancy test on the day of the procedure.  Plan to have someone take you home from the hospital or clinic.  You may have a blood or urine sample taken.  If you will be going home right after the procedure, plan to have someone with you for 24 hours. What happens during the procedure?  To reduce your risk of infection: ? Your health care team will wash or sanitize their hands. ? Your skin will be washed with soap.  An IV tube will be inserted into one of your veins.  You will be given one of the following: ? A medicine that numbs the area in and around the cervix (local anesthetic). ? A medicine to make you fall asleep (general anesthetic).  You will lie down on your back, with your feet in foot rests (stirrups).  The size and position of your uterus will be checked.  A lubricated instrument (speculum or Sims retractor) will be inserted into the back side of your vagina. The speculum will be used to hold apart the walls of your vagina so your health care provider can see your cervix.  A tool (tenaculum) will be attached to the lip of the cervix to stabilize it.  Your cervix will be softened and dilated. This may be done by: ? Taking a medicine. ? Having tapered dilators or thin rods (laminaria) or gradual widening instruments (tapered dilators) inserted into your cervix.  A small, sharp, curved instrument (curette) will be used to scrape a small amount of tissue or cells from the endometrium or cervical canal. In some cases, gentle suction is applied with the curette. The  curette will then be removed. The cells will be taken to a lab for testing. The procedure may vary among health care providers and hospitals. What happens after the procedure?  You may have mild cramping, backache, pain, and light bleeding or spotting. You may pass small blood clots from your vagina.  You may have to wear compression stockings. These stockings help to prevent blood clots and reduce swelling in your legs.  Your blood pressure, heart rate, breathing rate, and blood oxygen level will be monitored until the medicines you were given have worn off. Summary  Dilation and curettage (D&C) involves stretching (dilation) the cervix and scraping (curettage) the inside lining of the uterus (endometrium).  After the procedure, you may have mild cramping, backache, pain, and light bleeding or spotting. You may pass small blood clots from your vagina.  Plan to have someone take you home from the hospital or clinic. This information is not intended to replace advice given to you by your health care provider. Make sure you discuss any questions you have with your health care provider. Document Released: 11/07/2005 Document Revised: 07/24/2016 Document Reviewed: 07/24/2016 Elsevier Interactive Patient Education  2018 [employer]   Molar Pregnancy A molar pregnancy (hydatidiform mole) is a  mass of tissue that grows in the uterus after conception. The mass is created by an egg that was not fertilized correctly and abnormally grows. It is an abnormal pregnancy and does not develop into a fetus. If a molar pregnancy is suspected by your health care provider, treatment is required. What are the causes? Molar pregnancy is caused by an egg that is fertilized incorrectly so that it has abnormal genetic material (chromosomes). This can result in one of 2 types of molar pregnancy:  Complete molar pregnancy--All of the chromosomes in the fertilized egg come from the father; none come from the  mother.  Partial molar pregnancy--The fertilized egg has chromosomes from the father and mother, but it has too many chromosomes.  What increases the risk? Certain risk factors make a molar pregnancy more likely. They include:  Being over age 68 or under age 79.  History of a molar pregnancy in the past (extremely small chance of recurrence).  Other possible risk factors include:  Smoking more than 15 cigarettes per day.  History of infertility.  Having a certain blood type (A, B, AB).  Having a vitamin A deficiency.  Using oral contraceptives.  What are the signs or symptoms?  Vaginal bleeding.  Missed menstrual period.  Uterus grows quicker than normal.  Severe nausea and vomiting.  Severe pressure or pain in the uterus.  Abnormal ovarian cysts (theca lutein cysts).  Discharge from the vagina that looks like grapes.  High blood pressure (early onset of preeclampsia).  Overactive thyroid (hyperthyroidism).  Anemia. How is this diagnosed? If your health care provider thinks there is a chance of a molar pregnancy, testing will be recommended. Possible tests include:  An ultrasound test.  Blood tests.  How is this treated? Most molar pregnancies end on their own by miscarriage. However, a health care provider needs to make sure that all the abnormal tissue is out of the womb. This can be done with dilation and curettage (D&C) or suction curettage. In this procedure, any remaining molar tissue is removed through the vagina. After diagnosis of a molar pregnancy, the pregnancy hormone levels must be followed until the level is zero. If the pregnancy hormone level does not drop appropriately, chemotherapy may be necessary. Also, you will be given a medicine called Rho (D) immune globulin if you are Rh negative and your sex partner is Rh positive. This helps prevent Rh problems in future pregnancies. Follow these instructions at home:  Avoid getting pregnant for 6-12  months or as directed by your health care provider. Use a reliable form of birth control or do not have sex.  Only take over-the-counter or prescription medicine as directed by your health care provider.  Keep all follow-up appointments and get all suggested lab tests and ultrasound tests.  Gradually return to normal activities.  Think about joining a support group. Ask for help if you are struggling with grief. This information is not intended to replace advice given to you by your health care provider. Make sure you discuss any questions you have with your health care provider. Document Released: 07/26/2011 Document Revised: 04/14/2016 Document Reviewed: 06/06/2013 Elsevier Interactive Patient Education  2017 [employer].
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
## 2018-03-27 ENCOUNTER — Encounter (HOSPITAL_COMMUNITY)
Admission: RE | Disposition: A | Discharge status: Home / Self Care | Payer: Self-pay | Source: Ambulatory Visit | Attending: Obstetrics & Gynecology

## 2018-03-27 ENCOUNTER — Ambulatory Visit (HOSPITAL_COMMUNITY): Payer: Medicaid Other | Admitting: Anesthesiology

## 2018-03-27 ENCOUNTER — Other Ambulatory Visit: Payer: Self-pay

## 2018-03-27 ENCOUNTER — Encounter (HOSPITAL_COMMUNITY): Payer: Self-pay | Admitting: *Deleted

## 2018-03-27 ENCOUNTER — Ambulatory Visit (HOSPITAL_COMMUNITY)
Admission: RE | Admit: 2018-03-27 | Discharge: 2018-03-27 | Disposition: A | Discharge status: Home / Self Care | Payer: Medicaid Other | Source: Ambulatory Visit | Attending: Obstetrics & Gynecology | Admitting: Obstetrics & Gynecology

## 2018-03-27 ENCOUNTER — Ambulatory Visit (HOSPITAL_COMMUNITY): Payer: Medicaid Other

## 2018-03-27 DIAGNOSIS — F17200 Nicotine dependence, unspecified, uncomplicated: Secondary | ICD-10-CM | POA: Diagnosis not present

## 2018-03-27 DIAGNOSIS — O2691 Pregnancy related conditions, unspecified, first trimester: Secondary | ICD-10-CM

## 2018-03-27 DIAGNOSIS — N9489 Other specified conditions associated with female genital organs and menstrual cycle: Secondary | ICD-10-CM

## 2018-03-27 DIAGNOSIS — O020 Blighted ovum and nonhydatidiform mole: Secondary | ICD-10-CM | POA: Diagnosis present

## 2018-03-27 DIAGNOSIS — O0889 Other complications following an ectopic and molar pregnancy: Secondary | ICD-10-CM | POA: Diagnosis present

## 2018-03-27 DIAGNOSIS — O2690 Pregnancy related conditions, unspecified, unspecified trimester: Secondary | ICD-10-CM

## 2018-03-27 HISTORY — PX: DILATION AND EVACUATION: SHX1459

## 2018-03-27 HISTORY — DX: Anemia, unspecified: D64.9

## 2018-03-27 HISTORY — DX: Fracture of tooth (traumatic), initial encounter for closed fracture: S02.5XXA

## 2018-03-27 LAB — HIV ANTIBODY (ROUTINE TESTING W REFLEX): HIV Screen 4th Generation wRfx: NONREACTIVE

## 2018-03-27 LAB — GC/CHLAMYDIA PROBE AMP (~~LOC~~) NOT AT ARMC
CHLAMYDIA, DNA PROBE: NEGATIVE
NEISSERIA GONORRHEA: NEGATIVE

## 2018-03-27 LAB — PREPARE RBC (CROSSMATCH)

## 2018-03-27 LAB — HCG, QUANTITATIVE, PREGNANCY: HCG, BETA CHAIN, QUANT, S: 34255 m[IU]/mL — AB (ref <5)

## 2018-03-27 LAB — RPR: RPR Ser Ql: NONREACTIVE

## 2018-03-27 SURGERY — DILATION AND EVACUATION, UTERUS
Anesthesia: General | Site: Vagina

## 2018-03-27 MED ORDER — DEXAMETHASONE SODIUM PHOSPHATE 4 MG/ML IJ SOLN
INTRAMUSCULAR | Status: AC
  Filled 2018-03-27: qty 1

## 2018-03-27 MED ORDER — ONDANSETRON HCL 4 MG/2ML IJ SOLN
INTRAMUSCULAR | Status: DC | PRN
  Administered 2018-03-27: 4 mg via INTRAVENOUS

## 2018-03-27 MED ORDER — MIDAZOLAM HCL 2 MG/2ML IJ SOLN: 0.5000 mg | Freq: Once | INTRAMUSCULAR | Status: DC | PRN

## 2018-03-27 MED ORDER — LIDOCAINE HCL (CARDIAC) PF 100 MG/5ML IV SOSY
PREFILLED_SYRINGE | INTRAVENOUS | Status: AC
  Filled 2018-03-27: qty 5

## 2018-03-27 MED ORDER — DOXYCYCLINE HYCLATE 100 MG IV SOLR
200.0000 mg | INTRAVENOUS | Status: AC
  Administered 2018-03-27: 200 mg via INTRAVENOUS
  Filled 2018-03-27: qty 200

## 2018-03-27 MED ORDER — SCOPOLAMINE 1 MG/3DAYS TD PT72
MEDICATED_PATCH | TRANSDERMAL | Status: AC
  Filled 2018-03-27: qty 1

## 2018-03-27 MED ORDER — ONDANSETRON HCL 4 MG/2ML IJ SOLN
INTRAMUSCULAR | Status: AC
  Filled 2018-03-27: qty 2

## 2018-03-27 MED ORDER — MEPERIDINE HCL 25 MG/ML IJ SOLN: 6.2500 mg | INTRAMUSCULAR | Status: DC | PRN

## 2018-03-27 MED ORDER — IBUPROFEN 800 MG PO TABS: 800.0000 mg | ORAL_TABLET | Freq: Three times a day (TID) | ORAL | 2 refills | Status: DC | PRN

## 2018-03-27 MED ORDER — DEXAMETHASONE SODIUM PHOSPHATE 10 MG/ML IJ SOLN
INTRAMUSCULAR | Status: DC | PRN
  Administered 2018-03-27: 4 mg via INTRAVENOUS

## 2018-03-27 MED ORDER — PROMETHAZINE HCL 25 MG/ML IJ SOLN: 6.2500 mg | INTRAMUSCULAR | Status: DC | PRN

## 2018-03-27 MED ORDER — BUPIVACAINE HCL (PF) 0.5 % IJ SOLN
INTRAMUSCULAR | Status: AC
  Filled 2018-03-27: qty 30

## 2018-03-27 MED ORDER — MIDAZOLAM HCL 2 MG/2ML IJ SOLN
INTRAMUSCULAR | Status: DC | PRN
  Administered 2018-03-27: 2 mg via INTRAVENOUS

## 2018-03-27 MED ORDER — BUPIVACAINE HCL 0.5 % IJ SOLN
INTRAMUSCULAR | Status: DC | PRN
  Administered 2018-03-27: 30 mL

## 2018-03-27 MED ORDER — LACTATED RINGERS IV SOLN
INTRAVENOUS | Status: DC
  Administered 2018-03-27 (×2): via INTRAVENOUS

## 2018-03-27 MED ORDER — PROPOFOL 10 MG/ML IV BOLUS
INTRAVENOUS | Status: AC
  Filled 2018-03-27: qty 20

## 2018-03-27 MED ORDER — KETOROLAC TROMETHAMINE 30 MG/ML IJ SOLN
INTRAMUSCULAR | Status: DC | PRN
  Administered 2018-03-27: 30 mg via INTRAVENOUS

## 2018-03-27 MED ORDER — PROPOFOL 10 MG/ML IV BOLUS
INTRAVENOUS | Status: DC | PRN
  Administered 2018-03-27: 150 mg via INTRAVENOUS

## 2018-03-27 MED ORDER — FENTANYL CITRATE (PF) 100 MCG/2ML IJ SOLN
25.0000 ug | INTRAMUSCULAR | Status: DC | PRN
Start: 2018-03-27 — End: 2018-03-27

## 2018-03-27 MED ORDER — SODIUM CHLORIDE 0.9 % IV SOLN: INTRAVENOUS | Status: DC

## 2018-03-27 MED ORDER — RHO D IMMUNE GLOBULIN 1500 UNIT/2ML IJ SOSY
300.0000 ug | PREFILLED_SYRINGE | Freq: Once | INTRAMUSCULAR | Status: AC
  Administered 2018-03-27: 300 ug via INTRAMUSCULAR
  Filled 2018-03-27: qty 2

## 2018-03-27 MED ORDER — KETOROLAC TROMETHAMINE 30 MG/ML IJ SOLN
INTRAMUSCULAR | Status: AC
  Filled 2018-03-27: qty 1

## 2018-03-27 MED ORDER — LIDOCAINE HCL (CARDIAC) PF 100 MG/5ML IV SOSY
PREFILLED_SYRINGE | INTRAVENOUS | Status: DC | PRN
  Administered 2018-03-27: 100 mg via INTRAVENOUS

## 2018-03-27 MED ORDER — FENTANYL CITRATE (PF) 100 MCG/2ML IJ SOLN
INTRAMUSCULAR | Status: AC
  Filled 2018-03-27: qty 2

## 2018-03-27 MED ORDER — MIDAZOLAM HCL 2 MG/2ML IJ SOLN
INTRAMUSCULAR | Status: AC
  Filled 2018-03-27: qty 2

## 2018-03-27 MED ORDER — RHO D IMMUNE GLOBULIN 1500 UNIT/2ML IJ SOSY
300.0000 ug | PREFILLED_SYRINGE | Freq: Once | INTRAMUSCULAR | Status: DC
  Filled 2018-03-27: qty 2

## 2018-03-27 MED ORDER — SCOPOLAMINE 1 MG/3DAYS TD PT72
1.0000 | MEDICATED_PATCH | TRANSDERMAL | Status: DC
  Administered 2018-03-27: 1.5 mg via TRANSDERMAL

## 2018-03-27 MED ORDER — TRAMADOL HCL 50 MG PO TABS: 50.0000 mg | ORAL_TABLET | Freq: Four times a day (QID) | ORAL | 0 refills | Status: DC | PRN

## 2018-03-27 SURGICAL SUPPLY — 18 items
CATH ROBINSON RED A/P 16FR (CATHETERS) ×3 IMPLANT
DECANTER SPIKE VIAL GLASS SM (MISCELLANEOUS) ×3 IMPLANT
GLOVE BIOGEL PI IND STRL 7.0 (GLOVE) ×1 IMPLANT
GLOVE BIOGEL PI INDICATOR 7.0 (GLOVE) ×2
GLOVE ECLIPSE 7.0 STRL STRAW (GLOVE) ×3 IMPLANT
GOWN STRL REUS W/TWL LRG LVL3 (GOWN DISPOSABLE) ×6 IMPLANT
KIT BERKELEY 1ST TRIMESTER 3/8 (MISCELLANEOUS) ×3 IMPLANT
NS IRRIG 1000ML POUR BTL (IV SOLUTION) ×3 IMPLANT
PACK VAGINAL MINOR WOMEN LF (CUSTOM PROCEDURE TRAY) ×3 IMPLANT
PAD OB MATERNITY 4.3X12.25 (PERSONAL CARE ITEMS) ×3 IMPLANT
PAD PREP 24X48 CUFFED NSTRL (MISCELLANEOUS) ×3 IMPLANT
SET BERKELEY SUCTION TUBING (SUCTIONS) ×3 IMPLANT
TOWEL OR 17X24 6PK STRL BLUE (TOWEL DISPOSABLE) ×6 IMPLANT
VACURETTE 10 RIGID CVD (CANNULA) IMPLANT
VACURETTE 6 ASPIR F TIP BERK (CANNULA) IMPLANT
VACURETTE 7MM CVD STRL WRAP (CANNULA) IMPLANT
VACURETTE 8 RIGID CVD (CANNULA) IMPLANT
VACURETTE 9 RIGID CVD (CANNULA) IMPLANT

## 2018-03-27 NOTE — Discharge Instructions (Signed)
°  Post Anesthesia Home Care Instructions  Activity: Get plenty of rest for the remainder of the day. A responsible individual must stay with you for 24 hours following the procedure.  For the next 24 hours, DO NOT: -Drive a car -Advertising copywriter -Drink alcoholic beverages -Take any medication unless instructed by your physician -Make any legal decisions or sign important papers.  Meals: Start with liquid foods such as gelatin or soup. Progress to regular foods as tolerated. Avoid greasy, spicy, heavy foods. If nausea and/or vomiting occur, drink only clear liquids until the nausea and/or vomiting subsides. Call your physician if vomiting continues.  Special Instructions/Symptoms: Your throat may feel dry or sore from the anesthesia or the breathing tube placed in your throat during surgery. If this causes discomfort, gargle with warm salt water. The discomfort should disappear within 24 hours.  If you had a scopolamine patch placed behind your ear for the management of post- operative nausea and/or vomiting:  1. The medication in the patch is effective for 72 hours, after which it should be removed.  Wrap patch in a tissue and discard in the trash. Wash hands thoroughly with soap and water. 2. You may remove the patch earlier than 72 hours if you experience unpleasant side effects which may include dry mouth, dizziness or visual disturbances. 3. Avoid touching the patch. Wash your hands with soap and water after contact with the patch.     DISCHARGE INSTRUCTIONS: D&E The following instructions have been prepared to help you care for yourself upon your return home.   Personal hygiene:  Use sanitary pads for vaginal drainage, not tampons.  Shower the day after your procedure.  NO tub baths, pools or Jacuzzis for 2-3 weeks.  Wipe front to back after using the bathroom.  Activity and limitations:  Do NOT drive or operate any equipment for 24 hours. The effects of anesthesia are  still present and drowsiness may result.  Do NOT rest in bed all day.  Walking is encouraged.  Walk up and down stairs slowly.  You may resume your normal activity in one to two days or as indicated by your physician.  Sexual activity: NO intercourse for at least 2 weeks after the procedure, or as indicated by your physician.  Diet: Eat a light meal as desired this evening. You may resume your usual diet tomorrow.  Return to work: You may resume your work activities in one to two days or as indicated by your doctor.  What to expect after your surgery: Expect to have vaginal bleeding/discharge for 2-3 days and spotting for up to 10 days. It is not unusual to have soreness for up to 1-2 weeks. You may have a slight burning sensation when you urinate for the first day. Mild cramps may continue for a couple of days. You may have a regular period in 2-6 weeks.  Call your doctor for any of the following:  Excessive vaginal bleeding, saturating and changing one pad every hour.  Inability to urinate 6 hours after discharge from hospital.  Pain not relieved by pain medication.  Fever of 100.4 F or greater.  Unusual vaginal discharge or odor.   Call for an appointment:    Patients signature: ______________________  Nurses signature ________________________  Support person's signature_______________________

## 2018-03-27 NOTE — H&P (Signed)
Preoperative History and Physical  Maria Bowman is a 32 y.o. [id number] here for surgical management of abnormal pregnancy, suspected molar pregnancy.   No significant preoperative concerns.  Proposed surgery: Dilation and Evacuation under ultrasound guidance  Past Medical History:  Diagnosis Date  . Anemia   . Fractured tooth    upper front tooth fractured  . Hemorrhage after delivery of fetus 2003  . SVD (spontaneous vaginal delivery)    x 2   Past Surgical History:  Procedure Laterality Date  . INDUCED ABORTION     has had 6 elective abortions  . THERAPEUTIC ABORTION     OB History  Gravida Para Term Preterm AB Living  0 6 2  SAB TAB Ectopic Multiple Live Births    6     2    # Outcome Date GA Lbr Len/2nd Weight Sex Delivery Anes PTL Lv  10 Current           9 Gravida           8 TAB           7 TAB           6 TAB           5 TAB           4 TAB           3 TAB           2 Term           1 Term           Patient denies any other pertinent gynecologic issues.   No current facility-administered medications on file prior to encounter.    No current outpatient medications on file prior to encounter.   No Known Allergies  Social History:   reports that she has never smoked. She has never used smokeless tobacco. She reports that she drinks alcohol. She reports that she has current or past drug history. Drug: Marijuana.  History reviewed. No pertinent family history.  Review of Systems: Pertinent items noted in HPI and remainder of comprehensive ROS otherwise negative.  PHYSICAL EXAM: Blood pressure 112/80, pulse 62, temperature 98.3 F (36.8 C), temperature source Oral, resp. rate 18, height 5' 1.5" (1.562 m), weight 139 lb (63 kg), last menstrual period 01/21/2018, SpO2 100 %. CONSTITUTIONAL: Well-developed, well-nourished female in no acute distress.  HENT:  Normocephalic, atraumatic, External right and left ear normal. Oropharynx is clear and moist EYES:  Conjunctivae and EOM are normal. Pupils are equal, round, and reactive to light. No scleral icterus.  NECK: Normal range of motion, supple, no masses SKIN: Skin is warm and dry. No rash noted. Not diaphoretic. No erythema. No pallor. NEUROLOGIC: Alert and oriented to person, place, and time. Normal reflexes, muscle tone coordination. No cranial nerve deficit noted. PSYCHIATRIC: Normal mood and affect. Normal behavior. Normal judgment and thought content. CARDIOVASCULAR: Normal heart rate noted, regular rhythm RESPIRATORY: Effort and breath sounds normal, no problems with respiration noted ABDOMEN: Soft, nontender, nondistended. PELVIC: Deferred MUSCULOSKELETAL: Normal range of motion. No edema and no tenderness. 2+ distal pulses.  Labs: Results for orders placed or performed during the hospital encounter of 03/27/18 (from the past 336 hour(s))  Rh IG workup (includes ABO/Rh)   Collection Time: 03/27/18 12:42 PM  Result Value Ref Range   Gestational Age(Wks) 9    ABO/RH(D)      O NEG Performed at [facility], [address] [place], [place] [address]   Type and screen [facility] [place]   Collection Time: 03/27/18 12:43 PM  Result Value Ref Range   ABO/RH(D) O NEG    Antibody Screen PENDING    Sample Expiration      03/30/2018 Performed at [facility], [address], [place], [place] [address]   Results for orders placed or performed during the hospital encounter of 03/26/18 (from the past 336 hour(s))  Urinalysis, Routine w reflex microscopic   Collection Time: 03/26/18 11:20 AM  Result Value Ref Range   Color, Urine YELLOW YELLOW   APPearance CLEAR CLEAR   Specific Gravity, Urine 1.026 1.005 - 1.030   pH 6.0 5.0 - 8.0   Glucose, UA NEGATIVE NEGATIVE mg/dL   Hgb urine dipstick LARGE (A) NEGATIVE   Bilirubin Urine NEGATIVE NEGATIVE   Ketones, ur 20 (A) NEGATIVE mg/dL   Protein, ur NEGATIVE NEGATIVE mg/dL   Nitrite NEGATIVE NEGATIVE   Leukocytes, UA  NEGATIVE NEGATIVE   RBC / HPF 0-5 0 - 5 RBC/hpf   WBC, UA 0-5 0 - 5 WBC/hpf   Bacteria, UA NONE SEEN NONE SEEN   Squamous Epithelial / LPF 6-10 0 - 5   Mucus PRESENT   Pregnancy, urine POC   Collection Time: 03/26/18 11:54 AM  Result Value Ref Range   Preg Test, Ur POSITIVE (A) NEGATIVE  Wet prep, genital   Collection Time: 03/26/18 12:25 PM  Result Value Ref Range   Yeast Wet Prep HPF POC NONE SEEN NONE SEEN   Trich, Wet Prep NONE SEEN NONE SEEN   Clue Cells Wet Prep HPF POC NONE SEEN NONE SEEN   WBC, Wet Prep HPF POC FEW (A) NONE SEEN   Sperm NONE SEEN   CBC   Collection Time: 03/26/18 12:43 PM  Result Value Ref Range   WBC 4.6 4.0 - 10.5 K/uL   RBC 4.32 3.87 - 5.11 MIL/uL   Hemoglobin 10.4 (L) 12.0 - 15.0 g/dL   HCT 09.8 (L) 11.9 - 14.7 %   MCV 72.7 (L) 78.0 - 100.0 fL   MCH 24.1 (L) 26.0 - 34.0 pg   MCHC 33.1 30.0 - 36.0 g/dL   RDW 82.9 (H) 56.2 - 13.0 %   Platelets 239 150 - 400 K/uL  hCG, quantitative, pregnancy   Collection Time: 03/26/18 12:43 PM  Result Value Ref Range   hCG, Beta Chain, Quant, S 38,868 (H) <5 mIU/mL  RPR   Collection Time: 03/26/18 12:43 PM  Result Value Ref Range   RPR Ser Ql Non Reactive Non Reactive  HIV antibody (routine testing) (NOT for [facility])   Collection Time: 03/26/18 12:43 PM  Result Value Ref Range   HIV Screen 4th Generation wRfx Non Reactive Non Reactive  ABO/Rh   Collection Time: 03/26/18 12:43 PM  Result Value Ref Range   ABO/RH(D)      O NEG Performed at [facility], [address] [place], [place] [address]     Imaging Studies: US Ob Comp Less 14 Wks  Result Date: 03/26/2018 CLINICAL DATA:  Vaginal bleeding and cramping this morning in first trimester of pregnancy; quantitative beta HCG 46,962 EXAM: OBSTETRIC <14 WK [place] AND TRANSVAGINAL OB US TECHNIQUE: Both transabdominal and transvaginal ultrasound examinations were performed for complete evaluation of the gestation as well as the maternal uterus, adnexal regions,  and pelvic cul-de-sac. Transvaginal technique was performed to assess early pregnancy. COMPARISON:  None. FINDINGS: Intrauterine gestational sac: None identified Yolk sac:  N/A Embryo:  N/A Cardiac Activity: N/A Heart Rate: N/A  bpm MSD:   mm    w     d CRL:    mm    w    d                  [place] EDC: Subchorionic hemorrhage:  N/A Maternal uterus/adnexae: Marked heterogeneous thickening of the endometrial complex, mass-like in appearance up to 37 mm thick. No intrauterine gestational sac identified. Uterus otherwise normal morphology. RIGHT ovary normal size and morphology, 2.8 x 4.3 x 2.4 cm. LEFT ovary normal size and morphology 2.3 x 3.8 x 1.5 cm. No adnexal masses or free pelvic fluid. IMPRESSION: No intrauterine gestational sac identified. Marked masslike thickening of the endometrial complex up to 37 mm thick, concerning for gestational trophoblastic disease. Spontaneous abortion with endometrial canal distended by hemorrhage could cause a similar appearance, however. In the absence of visualization of an intrauterine pregnancy, ectopic pregnancy is not excluded though no adnexal mass or free pelvic fluid is visualized. Electronically Signed   By: Ulyses Southward M.D.   On: 03/26/2018 14:33   US Ob Transvaginal  Result Date: 03/26/2018 CLINICAL DATA:  Vaginal bleeding and cramping this morning in first trimester of pregnancy; quantitative beta HCG 38,868 EXAM: OBSTETRIC <14 WK [place] AND TRANSVAGINAL OB US TECHNIQUE: Both transabdominal and transvaginal ultrasound examinations were performed for complete evaluation of the gestation as well as the maternal uterus, adnexal regions, and pelvic cul-de-sac. Transvaginal technique was performed to assess early pregnancy. COMPARISON:  None. FINDINGS: Intrauterine gestational sac: None identified Yolk sac:  N/A Embryo:  N/A Cardiac Activity: N/A Heart Rate: N/A  bpm MSD:   mm    w     d CRL:    mm    w    d                  [place] EDC: Subchorionic hemorrhage:  N/A Maternal  uterus/adnexae: Marked heterogeneous thickening of the endometrial complex, mass-like in appearance up to 37 mm thick. No intrauterine gestational sac identified. Uterus otherwise normal morphology. RIGHT ovary normal size and morphology, 2.8 x 4.3 x 2.4 cm. LEFT ovary normal size and morphology 2.3 x 3.8 x 1.5 cm. No adnexal masses or free pelvic fluid. IMPRESSION: No intrauterine gestational sac identified. Marked masslike thickening of the endometrial complex up to 37 mm thick, concerning for gestational trophoblastic disease. Spontaneous abortion with endometrial canal distended by hemorrhage could cause a similar appearance, however. In the absence of visualization of an intrauterine pregnancy, ectopic pregnancy is not excluded though no adnexal mass or free pelvic fluid is visualized. Electronically Signed   By: Ulyses Southward M.D.   On: 03/26/2018 14:33    Assessment: Patient Active Problem List   Diagnosis Date Noted  . Suspected molar pregnancy 03/27/2018    Plan: Patient will undergo surgical management with Dilation and Evacuation under ultrasound guidance.   The risks of surgery were discussed in detail with the patient including but not limited to: bleeding, infection, injury to surrounding organs, need for additional procedures, possibility of intrauterine scarring which may impair future fertility, risk of retained products which may require further management and other postoperative/anesthesia complications were explained to patient.  Likelihood of success of complete evacuation of the uterus was discussed with the patient. Routine postoperative instructions will be reviewed with the patient after surgery.  The patient concurred with the proposed plan, giving informed written consent for the surgery.  Patient has been  NPO since last night and she will remain NPO for procedure.  Anesthesia and OR aware.  Preoperative prophylactic antibiotics and SCDs ordered on call to the OR.  To OR when  ready.    Jaynie Collins, MD, FACOG Obstetrician & Gynecologist, [facility] for [employer], [facility]

## 2018-03-27 NOTE — Anesthesia Preprocedure Evaluation (Addendum)
Anesthesia Evaluation  Patient identified by MRN, date of birth, ID band Patient awake    Reviewed: Allergy & Precautions, NPO status , Patient's Chart, lab work & pertinent test results  History of Anesthesia Complications Negative for: history of anesthetic complications  Airway Mallampati: II  TM Distance: >3 FB Neck ROM: Full    Dental  (+) Poor Dentition, Missing, Dental Advisory Given   Pulmonary Current Smoker,    breath sounds clear to auscultation       Cardiovascular negative cardio ROS   Rhythm:Regular Rate:Normal     Neuro/Psych negative neurological ROS     GI/Hepatic negative GI ROS, (+)     substance abuse  marijuana use,   Endo/Other  negative endocrine ROS  Renal/GU negative Renal ROS     Musculoskeletal   Abdominal   Peds  Hematology Hb 10.4, plt 239k   Anesthesia Other Findings   Reproductive/Obstetrics (+) Pregnancy (molar pregnancy)                            Anesthesia Physical Anesthesia Plan  ASA: II  Anesthesia Plan: General   Post-op Pain Management:    Induction: Intravenous  PONV Risk Score and Plan: 3 and Ondansetron, Dexamethasone and Scopolamine patch - Pre-op  Airway Management Planned: LMA  Additional Equipment:   Intra-op Plan:   Post-operative Plan:   Informed Consent: I have reviewed the patients History and Physical, chart, labs and discussed the procedure including the risks, benefits and alternatives for the proposed anesthesia with the patient or authorized representative who has indicated his/her understanding and acceptance.   Dental advisory given  Plan Discussed with: CRNA and Surgeon  Anesthesia Plan Comments: (Plan routine monitors, GA- LMA OK)        Anesthesia Quick Evaluation

## 2018-03-27 NOTE — Transfer of Care (Signed)
Immediate Anesthesia Transfer of Care Note  Patient: Maria Bowman  Procedure(s) Performed: DILATATION AND EVACUATION (N/A Vagina )  Patient Location: PACU  Anesthesia Type:General  Level of Consciousness: awake, alert  and oriented  Airway & Oxygen Therapy: Patient Spontanous Breathing and Patient connected to nasal cannula oxygen  Post-op Assessment: Report given to RN, Post -op Vital signs reviewed and stable and Patient moving all extremities  Post vital signs: Reviewed and stable  Last Vitals:  Vitals Value Taken Time  BP    Temp    Pulse 65 03/27/2018  3:36 PM  Resp 20 03/27/2018  3:36 PM  SpO2 100 % 03/27/2018  3:36 PM  Vitals shown include unvalidated device data.  Last Pain:  Vitals:   03/27/18 1259  TempSrc: Oral  PainSc: 2       Patients Stated Pain Goal: 5 (03/27/18 1259)  Complications: No apparent anesthesia complications

## 2018-03-27 NOTE — Anesthesia Postprocedure Evaluation (Signed)
Anesthesia Post Note  Patient: Maria Bowman  Procedure(s) Performed: DILATATION AND EVACUATION (N/A Vagina )     Patient location during evaluation: PACU Anesthesia Type: General Level of consciousness: awake and alert, oriented and patient cooperative Pain management: pain level controlled Vital Signs Assessment: post-procedure vital signs reviewed and stable Respiratory status: spontaneous breathing, nonlabored ventilation and respiratory function stable Cardiovascular status: blood pressure returned to baseline and stable Postop Assessment: no apparent nausea or vomiting Anesthetic complications: no    Last Vitals:  Vitals:   03/27/18 1645 03/27/18 1656  BP: 105/75 105/77  Pulse: 64 68  Resp: 16 19  Temp:    SpO2: 100% 99%    Last Pain:  Vitals:   03/27/18 1656  TempSrc:   PainSc: 0-No pain   Pain Goal: Patients Stated Pain Goal: 5 (03/27/18 1259)               Kween Bacorn,E. Yogi Arther

## 2018-03-27 NOTE — Anesthesia Procedure Notes (Signed)
Procedure Name: LMA Insertion Date/Time: 03/27/2018 2:48 PM Performed by: Elgie Congo, CRNA Pre-anesthesia Checklist: Patient identified, Emergency Drugs available, Suction available and Patient being monitored Patient Re-evaluated:Patient Re-evaluated prior to induction Oxygen Delivery Method: Circle system utilized Preoxygenation: Pre-oxygenation with 100% oxygen Induction Type: IV induction LMA: LMA inserted LMA Size: 4.0 Number of attempts: 1 Placement Confirmation: positive ETCO2 and breath sounds checked- equal and bilateral Tube secured with: Tape Dental Injury: Teeth and Oropharynx as per pre-operative assessment

## 2018-03-27 NOTE — Op Note (Signed)
Lianne Cure PROCEDURE DATE:  03/27/2018  PREOPERATIVE DIAGNOSIS: Abnormal pregnancy, endometrial mass POSTOPERATIVE DIAGNOSIS: The same PROCEDURE:  Dilation and Evacuation under ultrasound guidance SURGEON:  Dr. Jaynie Collins  INDICATIONS: 32 y.o.  [id number] with abnormal pregnancy/suspected molar pregnancy needing surgical evaluation  Risks of surgery were discussed with the patient and her friend including but not limited to: bleeding which may require transfusion; infection which may require antibiotics; injury to uterus or surrounding organs; need for additional procedures including laparotomy or laparoscopy; possibility of intrauterine scarring which may impair future fertility; and other postoperative/anesthesia complications. Written informed consent was obtained.  FINDINGS:  Significant amount of endometrial contents.  Empty endometrial stripe noted on ultrasound at the end of the procedure.   ANESTHESIA:    General, paracervical block with 30 ml of 0.5% Marcaine ESTIMATED BLOOD LOSS:  20 ml. SPECIMENS:  Products of conception sent to pathology COMPLICATIONS:  None immediate.  PROCEDURE DETAILS:  The patient received intravenous Doxycycline while in the preoperative area.  She was then taken to the operating room whereanesthesia  was administered and was found to be adequate.  After an adequate timeout was performed, she was placed in the dorsal lithotomy position and examined; then prepped and draped in the sterile manner.   Her bladder was catheterized for an unmeasured amount of clear, yellow urine. A vaginal speculum was then placed in the patient's vagina and a single tooth tenaculum was applied to the anterior lip of the cervix.  A paracervical block using 30 ml of 0.5% Marcaine was administered. The cervix was gently dilated under ultrasound guidance to accommodate a 8 mm suction curette that was gently advanced to the uterine fundus.  The suction device was then activated and curette  slowly rotated to clear the uterus of products of conception.  A sharp curettage was then performed to confirm complete emptying of the uterus. There was an empty endometrial stripe noted on the ultrasound at the end of the curettage.  There was minimal bleeding noted at the end of the procedure, and the tenaculum removed with good hemostasis noted.   All instruments were removed from the patient's vagina.  Sponge and instrument counts were correct times three.    The patient tolerated the procedure well and was taken to the recovery area awake, extubated and in stable condition.  The patient will be discharged to home as per PACU criteria.  Routine postoperative instructions given.  She was prescribed Tramadol, Ibuprofen and Colace.  She will follow up in the clinic in 2-3 weeks for postoperative evaluation.    Jaynie Collins, MD, FACOG Attending Obstetrician & Gynecologist Faculty Practice, [facility]

## 2018-03-27 NOTE — Progress Notes (Signed)
Patient waiting for ride to pick up.  Rhogam given via IV with normal saline.  No reaction noted. VS normal at 1810.  Patient d/c home with friend [place] via car at 1810.

## 2018-03-28 ENCOUNTER — Encounter (HOSPITAL_COMMUNITY): Payer: Self-pay | Admitting: Obstetrics & Gynecology

## 2018-03-28 LAB — RH IG WORKUP (INCLUDES ABO/RH)
ABO/RH(D): O NEG
Gestational Age(Wks): 9
UNIT DIVISION: 0

## 2018-03-30 ENCOUNTER — Encounter: Payer: Self-pay | Admitting: Obstetrics & Gynecology

## 2018-03-30 NOTE — Progress Notes (Signed)
03/27/2018 Pathology Diagnosis - CHORIONIC VILLI WITH FEATURES CONSISTENT WITH MOLAR GESTATION. Morphology consistent with partial mole.   For patient's molar pregnancy, she needs weekly labs for hCG levels until three consecutive negative (HCG <5) values are obtained.  A plateau or rise in hCG may be indicative of the development of persistent trophoblastic disease, and will necessitate further evaluation and treatment. After three weeks of negative values, patients are followed with monthly hCG levels for a total of six months. Patient should be strongly encouraged to be on contraception during this entire surveillance period.  A new pregnancy during this period would make it difficult or impossible to interpret hCG results and would complicate management.   I attempted to call patient and got her voicemail; left message for her to call back for results.   She already has a follow up appointment with me on 04/19/18; she just needs two appointments for lab draws for next week and the week after. (weeks of  5/13 and 5/20). Subsequent lab appointments will be made after her 04/19/18 appointment; she will need weekly appointments.   Please call to inform patient of results, recommendations and lab appointments.   Jaynie Collins, MD, FACOG Obstetrician & Gynecologist, [facility] for [employer], [facility]

## 2018-03-31 LAB — TYPE AND SCREEN
ABO/RH(D): O NEG
ANTIBODY SCREEN: POSITIVE
UNIT DIVISION: 0
Unit division: 0

## 2018-03-31 LAB — BPAM RBC
Blood Product Expiration Date: 201905282359
Blood Product Expiration Date: 201906062359
UNIT TYPE AND RH: 9500
Unit Type and Rh: 9500

## 2018-04-02 ENCOUNTER — Telehealth: Payer: Self-pay | Admitting: *Deleted

## 2018-04-02 ENCOUNTER — Ambulatory Visit (INDEPENDENT_AMBULATORY_CARE_PROVIDER_SITE_OTHER): Payer: Medicaid Other | Admitting: *Deleted

## 2018-04-02 DIAGNOSIS — O0889 Other complications following an ectopic and molar pregnancy: Secondary | ICD-10-CM

## 2018-04-02 LAB — HCG, QUANTITATIVE, PREGNANCY: HCG, BETA CHAIN, QUANT, S: 1585 m[IU]/mL — AB (ref <5)

## 2018-04-02 NOTE — Telephone Encounter (Signed)
I called Maria Bowman to notify her she had missed her stat bhcg lab appt. She states she was confused when her appointments were. She states she can still come later today and will come 1:45.

## 2018-04-02 NOTE — Progress Notes (Signed)
Denies any pain. States she is having light bleeding enough to only wear panty liner. Explained to her will draw stat bhcg and have her wait for results in lobby - about 1/5 hours. Then will review with provider and her.  Also discussed since she had molar pregnancy will draw weekly until negative x 3 weeks, then monthly x 6 months. Ectopic precautions given.   Reviewed results with Dr. Vergie Living and informed patient levels had dropped significantly and Dr. Vergie Living reccommends stat bhcg one week, then after that routine bhcg until negative x 3 weeks and then monthly x 6 months. Also reviewed ectopic precautions. She voices understanding.

## 2018-04-03 NOTE — Progress Notes (Signed)
I have reviewed the chart and agree with nursing staff's documentation of this patient's encounter.   Bing, MD 04/03/2018 9:46 AM

## 2018-04-09 ENCOUNTER — Ambulatory Visit: Payer: Medicaid Other

## 2018-04-09 ENCOUNTER — Telehealth: Payer: Self-pay | Admitting: General Practice

## 2018-04-09 NOTE — Telephone Encounter (Signed)
-----   Message from Tereso Newcomer, MD sent at 03/30/2018  8:00 AM EDT ----- 03/27/2018 Pathology Diagnosis - CHORIONIC VILLI WITH FEATURES CONSISTENT WITH MOLAR GESTATION. Morphology consistent with partial mole.   For patient's molar pregnancy, she needs weekly labs for hCG levels until three consecutive negative (HCG <5) values are obtained.  A plateau or rise in hCG may be indicative of the development of persistent trophoblastic disease, and will necessitate further evaluation and treatment. After three weeks of negative values, patients are followed with monthly hCG levels for a total of six months. Patient should be strongly encouraged to be on contraception during this entire surveillance period.  A new pregnancy during this period would make it difficult or impossible to interpret hCG results and would complicate management.   I attempted to call patient and got her voicemail; left message for her to call back for results.   She already has a follow up appointment with me on 04/19/18; she just needs two appointments for lab draws for next week and the week after. (weeks of  5/13 and 5/20). Subsequent lab appointments will be made after her 04/19/18 appointment; she will need weekly appointments.   Please call to inform patient of results, recommendations and lab appointments.   Jaynie Collins, MD, FACOG Obstetrician & Gynecologist, [facility] for [employer], [facility]

## 2018-04-09 NOTE — Telephone Encounter (Signed)
Per chart review, patient came in last week for lab on 5/13. Patient had appt for today but no showed. Patient is aware of molar pregnancy. Called patient, no answer- left message on voicemail stating we are trying to reach you since you missed your lab appt with [place] today for blood work, please call our office to reschedule for tomorrow or Wednesday.

## 2018-04-19 ENCOUNTER — Ambulatory Visit (INDEPENDENT_AMBULATORY_CARE_PROVIDER_SITE_OTHER): Payer: Medicaid Other | Admitting: Obstetrics & Gynecology

## 2018-04-19 ENCOUNTER — Encounter: Payer: Self-pay | Admitting: Obstetrics & Gynecology

## 2018-04-19 VITALS — BP 128/87 | HR 55 | Ht 61.0 in | Wt 142.5 lb

## 2018-04-19 DIAGNOSIS — O0889 Other complications following an ectopic and molar pregnancy: Secondary | ICD-10-CM

## 2018-04-19 DIAGNOSIS — Z9889 Other specified postprocedural states: Secondary | ICD-10-CM

## 2018-04-19 NOTE — Patient Instructions (Signed)
For patient's molar pregnancy, she needs weekly labs for hCG levels until three consecutive negative (HCG <5) values are obtained.    A plateau or rise in hCG may be indicative of the development of persistent trophoblastic disease, and will necessitate further evaluation and treatment.   After three weeks of negative values, patients are followed with monthly hCG levels for a total of six months.  Patient should be strongly encouraged to be on contraception during this entire surveillance period.  A new pregnancy during this period would make it difficult or impossible to interpret hCG results and would complicate management.    Molar Pregnancy A molar pregnancy (hydatidiform mole) is a mass of tissue that grows in the uterus after conception. The mass is created by an egg that was not fertilized correctly and abnormally grows. It is an abnormal pregnancy and does not develop into a fetus. If a molar pregnancy is suspected by your health care provider, treatment is required. What are the causes? Molar pregnancy is caused by an egg that is fertilized incorrectly so that it has abnormal genetic material (chromosomes). This can result in one of 2 types of molar pregnancy:  Complete molar pregnancy-All of the chromosomes in the fertilized egg come from the father; none come from the mother.  Partial molar pregnancy-The fertilized egg has chromosomes from the father and mother, but it has too many chromosomes.  What increases the risk? Certain risk factors make a molar pregnancy more likely. They include:  Being over age 24 or under age 84.  History of a molar pregnancy in the past (extremely small chance of recurrence).  Other possible risk factors include:  Smoking more than 15 cigarettes per day.  History of infertility.  Having a certain blood type (A, B, AB).  Having a vitamin A deficiency.  Using oral contraceptives.  What are the signs or symptoms?  Vaginal  bleeding.  Missed menstrual period.  Uterus grows quicker than normal.  Severe nausea and vomiting.  Severe pressure or pain in the uterus.  Abnormal ovarian cysts (theca lutein cysts).  Discharge from the vagina that looks like grapes.  High blood pressure (early onset of preeclampsia).  Overactive thyroid (hyperthyroidism).  Anemia. How is this diagnosed? If your health care provider thinks there is a chance of a molar pregnancy, testing will be recommended. Possible tests include:  An ultrasound test.  Blood tests.  How is this treated? Most molar pregnancies end on their own by miscarriage. However, a health care provider needs to make sure that all the abnormal tissue is out of the womb. This can be done with dilation and curettage (D&C) or suction curettage. In this procedure, any remaining molar tissue is removed through the vagina. After diagnosis of a molar pregnancy, the pregnancy hormone levels must be followed until the level is zero. If the pregnancy hormone level does not drop appropriately, chemotherapy may be necessary. Also, you will be given a medicine called Rho (D) immune globulin if you are Rh negative and your sex partner is Rh positive. This helps prevent Rh problems in future pregnancies. Follow these instructions at home:  Avoid getting pregnant for 6-12 months or as directed by your health care provider. Use a reliable form of birth control or do not have sex.  Only take over-the-counter or prescription medicine as directed by your health care provider.  Keep all follow-up appointments and get all suggested lab tests and ultrasound tests.  Gradually return to normal activities.  Think about  joining a support group. Ask for help if you are struggling with grief. This information is not intended to replace advice given to you by your health care provider. Make sure you discuss any questions you have with your health care provider. Document Released:  07/26/2011 Document Revised: 04/14/2016 Document Reviewed: 06/06/2013 Elsevier Interactive Patient Education  2017 [employer].

## 2018-04-19 NOTE — Progress Notes (Signed)
Subjective:     Maria Bowman is a 32 y.o. female who presents to the clinic 3 weeks status post D&E for partial molar pregnancy. Eating a regular diet without difficulty. Bowel movements are normal. The patient is not having any pain.  The following portions of the patient's history were reviewed and updated as appropriate: allergies, current medications, past family history, past medical history, past social history, past surgical history and problem list. Normal pap smear in 09/2017 at [facility].  Review of Systems Pertinent items noted in HPI and remainder of comprehensive ROS otherwise negative.     Objective:    BP 128/87   Pulse (!) 55   Ht  (1.549 m)   Wt 142 lb 8 oz (64.6 kg)   LMP 01/21/2018 (Approximate)   Breastfeeding? Unknown   BMI 26.93 kg/m  General:  alert and no distress  Abdomen: soft, bowel sounds active, non-tender  Pelvic:   deferred      Labs:  Ref Range & Units 2wk ago (04/02/18) 3wk ago (03/27/18) 3wk ago (03/26/18)  hCG, Beta Chain, Quant, S <5 mIU/mL 1,585High   34,255 46,962   03/27/2018 Pathology Diagnosis - CHORIONIC VILLI WITH FEATURES CONSISTENT WITH MOLAR GESTATION. Morphology consistent with partial mole.  Assessment:    Doing well postoperatively. Operative findings again reviewed. Pathology report discussed.    Plan:  For patient's molar pregnancy, she needs weekly labs for hCG levels until three consecutive negative (HCG <5) values are obtained. A plateau or rise in hCG may be indicative of the development of persistent trophoblastic disease, and will necessitate further evaluation and treatment. After three weeks of negative values, patients are followed with monthly hCG levels for a total of six months. Patient  strongly encouraged to be on contraception during this entire surveillance period; she declines hormonal contraception. A new pregnancy during this period would make it difficult or impossible to interpret hCG results and would complicate  management.   Will continue serial HCG checks.    Jaynie Collins, MD, FACOG Obstetrician & Gynecologist, [facility] for [employer], [facility]

## 2018-04-20 LAB — BETA HCG QUANT (REF LAB): HCG QUANT: 17 m[IU]/mL

## 2018-05-17 ENCOUNTER — Encounter (HOSPITAL_COMMUNITY): Payer: Self-pay

## 2018-05-17 ENCOUNTER — Emergency Department (HOSPITAL_COMMUNITY)
Admission: EM | Admit: 2018-05-17 | Discharge: 2018-05-17 | Disposition: A | Discharge status: Home / Self Care | Payer: Medicaid Other | Attending: Emergency Medicine | Admitting: Emergency Medicine

## 2018-05-17 DIAGNOSIS — Z041 Encounter for examination and observation following transport accident: Secondary | ICD-10-CM | POA: Diagnosis present

## 2018-05-17 DIAGNOSIS — Z79899 Other long term (current) drug therapy: Secondary | ICD-10-CM | POA: Diagnosis not present

## 2018-05-17 MED ORDER — CYCLOBENZAPRINE HCL 10 MG PO TABS: 10.0000 mg | ORAL_TABLET | Freq: Two times a day (BID) | ORAL | 0 refills | Status: DC | PRN
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
## 2018-05-17 NOTE — Discharge Instructions (Addendum)
Please read attached information. If you experience any new or worsening signs or symptoms please return to the emergency room for evaluation. Please follow-up with your primary care provider or specialist as discussed. Please use medication prescribed only as directed and discontinue taking if you have any concerning signs or symptoms.   °

## 2018-08-29 ENCOUNTER — Other Ambulatory Visit: Payer: Self-pay

## 2018-08-29 ENCOUNTER — Emergency Department (HOSPITAL_COMMUNITY): Payer: Medicaid Other

## 2018-08-29 ENCOUNTER — Emergency Department (HOSPITAL_COMMUNITY)
Admission: EM | Admit: 2018-08-29 | Discharge: 2018-08-30 | Disposition: A | Discharge status: Home / Self Care | Payer: Medicaid Other | Attending: Emergency Medicine | Admitting: Emergency Medicine

## 2018-08-29 ENCOUNTER — Encounter (HOSPITAL_COMMUNITY): Payer: Self-pay | Admitting: Emergency Medicine

## 2018-08-29 DIAGNOSIS — Z5321 Procedure and treatment not carried out due to patient leaving prior to being seen by health care provider: Secondary | ICD-10-CM | POA: Diagnosis not present

## 2018-08-29 DIAGNOSIS — R079 Chest pain, unspecified: Secondary | ICD-10-CM | POA: Insufficient documentation

## 2018-08-29 DIAGNOSIS — R51 Headache: Secondary | ICD-10-CM | POA: Diagnosis not present

## 2018-08-29 LAB — CBC
HCT: 31.7 % — ABNORMAL LOW (ref 36.0–46.0)
Hemoglobin: 10 g/dL — ABNORMAL LOW (ref 12.0–15.0)
MCH: 23.1 pg — AB (ref 26.0–34.0)
MCHC: 31.5 g/dL (ref 30.0–36.0)
MCV: 73.2 fL — ABNORMAL LOW (ref 80.0–100.0)
Platelets: 321 10*3/uL (ref 150–400)
RBC: 4.33 MIL/uL (ref 3.87–5.11)
RDW: 17.7 % — ABNORMAL HIGH (ref 11.5–15.5)
WBC: 6.4 10*3/uL (ref 4.0–10.5)
nRBC: 0 % (ref 0.0–0.2)

## 2018-08-29 LAB — BASIC METABOLIC PANEL
ANION GAP: 10 (ref 5–15)
BUN: 15 mg/dL (ref 6–20)
CALCIUM: 9.6 mg/dL (ref 8.9–10.3)
CO2: 20 mmol/L — ABNORMAL LOW (ref 22–32)
Chloride: 106 mmol/L (ref 98–111)
Creatinine, Ser: 0.72 mg/dL (ref 0.44–1.00)
GFR calc Af Amer: >60 mL/min (ref >60)
GLUCOSE: 87 mg/dL (ref 70–99)
Potassium: 3.4 mmol/L — ABNORMAL LOW (ref 3.5–5.1)
Sodium: 136 mmol/L (ref 135–145)

## 2018-08-29 LAB — I-STAT BETA HCG BLOOD, ED (MC, WL, AP ONLY): I-stat hCG, quantitative: <5 m[IU]/mL (ref <5)

## 2018-08-29 LAB — I-STAT TROPONIN, ED: TROPONIN I, POC: 0 ng/mL (ref 0.00–0.08)

## 2018-08-29 NOTE — ED Triage Notes (Addendum)
Previously entered information entered in wrong chart.

## 2018-08-29 NOTE — ED Triage Notes (Signed)
Pt reports 9/10 right sided temple pain that started 4-5 days ago. Pt reports pain when touching temple and states she notices her blood vessel is sticking out. Pt reports left cp the last 3-4 weeks that makes her left arm go numb. Denies any other sx.

## 2018-08-30 NOTE — ED Notes (Signed)
Called in waiting room with no answer 

## 2018-08-30 NOTE — ED Notes (Signed)
Pt has been called three different times from the tech for vitals and has not answered.

## 2018-08-30 NOTE — ED Notes (Signed)
No answer when called for vitals in waiting room.

## 2018-11-21 NOTE — L&D Delivery Note (Addendum)
OB/GYN Faculty Practice Delivery Note  Maria Bowman is a 33 y.o. [id number] s/p SVD at [redacted]w[redacted]d. She was admitted for SOL after SROM at home around 1900 with clear fuid.   GBS Status:  Positive/-- (11/16 1228) s/p IC Ampicillin x1 dose Maximum Maternal Temperature: 98.1   Labor Progress: . Patient arrived at 2 cm dilation and progressed quickly w/o augmentation. She was given a dose of TXA about 30 min prior to delivery, in the setting of history of PPH.  Delivery Date/Time: 11/09/2019 at 2302 Delivery: Called to room and patient was complete and pushing. Head delivered in LOA position. No nuchal cord present. Shoulder and body delivered in usual fashion. Infant with spontaneous cry, placed on mother's abdomen, dried and stimulated. Cord clamped x 2 after 1-minute delay, and cut by sister of the baby. Cord blood drawn. Placenta delivered spontaneously with gentle cord traction. Fundus firm with massage, pitocin and Cytotec 1062mcg. Labia, perineum, vagina, and cervix inspected with 1st degree perineal laceration which was hemostatic and did not require repair.   Placenta: spontaneous, intact, 3 vessel cord Complications: history of PPH, mitigated w/ TXA, pitocin, Cytotec Lacerations: One 1st degree perineal laceration which was hemostatic and did not require repair EBL: 461cc Analgesia: Epidural   Infant: APGAR (1 MIN): 8   APGAR (5 MINS): 9    Weight: pending 1 hour skin to skin  Demetrius Revel, MD PGY-3  Midwife attestation: I was gloved and present for delivery in its entirety and I agree with the above resident's note.  Large gushing of blood prior to delivery of placenta and continued trickling after delivery- based on hx of PPH - cytotec ordered/given in addition to TXA ( 439mcg buccal, 443mcg rectal). Fundus firm and bleeding slowed prior to leaving room.   Lajean Manes, CNM 1:48 AM

## 2019-04-05 LAB — CYTOLOGY - PAP: Pap: NEGATIVE

## 2019-05-01 ENCOUNTER — Inpatient Hospital Stay (HOSPITAL_COMMUNITY): Payer: Medicaid Other

## 2019-05-01 ENCOUNTER — Inpatient Hospital Stay (HOSPITAL_COMMUNITY)
Admission: AD | Admit: 2019-05-01 | Discharge: 2019-05-01 | Disposition: A | Discharge status: Home / Self Care | Payer: Medicaid Other | Attending: Obstetrics and Gynecology | Admitting: Obstetrics and Gynecology

## 2019-05-01 ENCOUNTER — Other Ambulatory Visit: Payer: Self-pay

## 2019-05-01 ENCOUNTER — Encounter (HOSPITAL_COMMUNITY): Payer: Self-pay | Admitting: *Deleted

## 2019-05-01 DIAGNOSIS — O26859 Spotting complicating pregnancy, unspecified trimester: Secondary | ICD-10-CM

## 2019-05-01 DIAGNOSIS — R109 Unspecified abdominal pain: Secondary | ICD-10-CM | POA: Diagnosis not present

## 2019-05-01 DIAGNOSIS — O26851 Spotting complicating pregnancy, first trimester: Secondary | ICD-10-CM | POA: Diagnosis not present

## 2019-05-01 DIAGNOSIS — B9689 Other specified bacterial agents as the cause of diseases classified elsewhere: Secondary | ICD-10-CM | POA: Diagnosis not present

## 2019-05-01 DIAGNOSIS — O23591 Infection of other part of genital tract in pregnancy, first trimester: Secondary | ICD-10-CM | POA: Insufficient documentation

## 2019-05-01 DIAGNOSIS — Z3A13 13 weeks gestation of pregnancy: Secondary | ICD-10-CM

## 2019-05-01 DIAGNOSIS — O26891 Other specified pregnancy related conditions, first trimester: Secondary | ICD-10-CM | POA: Diagnosis not present

## 2019-05-01 LAB — CBC WITH DIFFERENTIAL/PLATELET
Abs Immature Granulocytes: 0 10*3/uL (ref 0.00–0.07)
Basophils Absolute: 0 10*3/uL (ref 0.0–0.1)
Basophils Relative: 0 %
Eosinophils Absolute: 0.2 10*3/uL (ref 0.0–0.5)
Eosinophils Relative: 2 %
HCT: 28.7 % — ABNORMAL LOW (ref 36.0–46.0)
Hemoglobin: 8.9 g/dL — ABNORMAL LOW (ref 12.0–15.0)
Lymphocytes Relative: 29 %
Lymphs Abs: 2.2 10*3/uL (ref 0.7–4.0)
MCH: 21.4 pg — ABNORMAL LOW (ref 26.0–34.0)
MCHC: 31 g/dL (ref 30.0–36.0)
MCV: 69.2 fL — ABNORMAL LOW (ref 80.0–100.0)
Monocytes Absolute: 0.2 10*3/uL (ref 0.1–1.0)
Monocytes Relative: 3 %
Neutro Abs: 5 10*3/uL (ref 1.7–7.7)
Neutrophils Relative %: 66 %
Platelets: 275 10*3/uL (ref 150–400)
RBC: 4.15 MIL/uL (ref 3.87–5.11)
RDW: 20 % — ABNORMAL HIGH (ref 11.5–15.5)
WBC: 7.5 10*3/uL (ref 4.0–10.5)
nRBC: 0 % (ref 0.0–0.2)
nRBC: 1 /100 WBC — ABNORMAL HIGH

## 2019-05-01 LAB — WET PREP, GENITAL
Sperm: NONE SEEN
Trich, Wet Prep: NONE SEEN
Yeast Wet Prep HPF POC: NONE SEEN

## 2019-05-01 LAB — URINALYSIS, ROUTINE W REFLEX MICROSCOPIC
Bilirubin Urine: NEGATIVE
Glucose, UA: NEGATIVE mg/dL
Hgb urine dipstick: NEGATIVE
Ketones, ur: 5 mg/dL — AB
Leukocytes,Ua: NEGATIVE
Nitrite: NEGATIVE
Protein, ur: NEGATIVE mg/dL
Specific Gravity, Urine: 1.024 (ref 1.005–1.030)
pH: 5 (ref 5.0–8.0)

## 2019-05-01 LAB — POCT PREGNANCY, URINE: Preg Test, Ur: POSITIVE — AB

## 2019-05-01 LAB — HCG, QUANTITATIVE, PREGNANCY: hCG, Beta Chain, Quant, S: 133428 m[IU]/mL — ABNORMAL HIGH (ref <5)

## 2019-05-01 MED ORDER — METRONIDAZOLE 500 MG PO TABS: 500.0000 mg | ORAL_TABLET | Freq: Two times a day (BID) | ORAL | 0 refills | Status: DC

## 2019-05-01 MED ORDER — POLYSACCHARIDE IRON COMPLEX 150 MG PO CAPS: 150.0000 mg | ORAL_CAPSULE | ORAL | 6 refills | Status: DC

## 2019-05-01 MED ORDER — RHO D IMMUNE GLOBULIN 1500 UNIT/2ML IJ SOSY
300.0000 ug | PREFILLED_SYRINGE | Freq: Once | INTRAMUSCULAR | Status: AC
  Administered 2019-05-01: 300 ug via INTRAMUSCULAR
  Filled 2019-05-01: qty 2

## 2019-05-01 MED ORDER — PREPLUS 27-1 MG PO TABS: 1.0000 | ORAL_TABLET | Freq: Every day | ORAL | 13 refills | Status: AC

## 2019-05-01 NOTE — Discharge Instructions (Signed)

## 2019-05-01 NOTE — Progress Notes (Signed)
Mallie Snooks CNM informed pt is O neg and was positive for anti-D in 2019.   Gilmer Mor RN

## 2019-05-01 NOTE — MAU Note (Signed)
.   Maria Bowman is a 33 y.o. at [redacted]w[redacted]d here in MAU reporting: lower abdominal cramping with spotting a couple of days ago. States she had a molar pregnancy last year and is just concerned  LMP: 02/03/19  Pain score: 5 Vitals:   05/01/19 1811  BP: 127/79  Pulse: 66  Resp: 16  Temp: 98.2 F (36.8 C)     [id number] Lab orders placed from triage:

## 2019-05-01 NOTE — MAU Provider Note (Signed)
History     CSN: [id number]  Arrival date and time: 05/01/19 1751   First Provider Initiated Contact with Patient 05/01/19 1830      Chief Complaint  Patient presents with  . Vaginal Bleeding  . Abdominal Pain   HPI Maria Bowman is a 33 y.o. [id number] at [redacted]w[redacted]d who presents to MAU with chief complaints of vaginal spotting and abdominal cramping. These are new problems, onset 2-3 days ago. Patient states bleeding was "just once and just a little" but abdominal cramping is mild and continuous since onset.   She rates her abdominal pain as 5/10 and denies aggravating or alleviating factors. She has not taken medication or tried other treatments for this complaint. She denies frank vaginal bleeding, abdominal tenderness, abnormal vaginal discharge, dysuria, fever or recent illness.  Patient is remote from sexual intercourse. She denies concern for IPV, SI and HI.   OB History    Gravida  11   Para  2   Term  2   Preterm  0   AB  8   Living  2     SAB  1   TAB  6   Ectopic  0   Multiple  0   Live Births  2           Past Medical History:  Diagnosis Date  . Anemia   . Fractured tooth    upper front tooth fractured  . Hemorrhage after delivery of fetus 2003  . SVD (spontaneous vaginal delivery)    x 2    Past Surgical History:  Procedure Laterality Date  . DILATION AND EVACUATION N/A 03/27/2018   Procedure: DILATATION AND EVACUATION;  Surgeon: Osborne Oman, MD;  Location: [facility];  Service: Gynecology;  Laterality: N/A;  Ultrasound guided  . INDUCED ABORTION     has had 6 elective abortions  . THERAPEUTIC ABORTION      History reviewed. No pertinent family history.  Social History   Tobacco Use  . Smoking status: Never Smoker  . Smokeless tobacco: Never Used  Substance Use Topics  . Alcohol use: Not Currently    Comment: socially  . Drug use: Yes    Types: Marijuana    Comment: Last use June 2020    Allergies: No Known  Allergies  Medications Prior to Admission  Medication Sig Dispense Refill Last Dose  . ibuprofen (ADVIL,MOTRIN) 800 MG tablet Take 1 tablet (800 mg total) by mouth 3 (three) times daily with meals as needed for headache, mild pain, moderate pain or cramping. 30 tablet 2 Past Week at Unknown time  . cyclobenzaprine (FLEXERIL) 10 MG tablet Take 1 tablet (10 mg total) by mouth 2 (two) times daily as needed for muscle spasms. 20 tablet 0 More than a month at Unknown time    Review of Systems  Constitutional: Negative for chills, fatigue and fever.  Respiratory: Negative for shortness of breath.   Gastrointestinal: Positive for abdominal pain.  Genitourinary: Positive for vaginal discharge. Negative for difficulty urinating, dysuria, flank pain, vaginal bleeding and vaginal pain.  Musculoskeletal: Negative for back pain.  Neurological: Negative for headaches.  All other systems reviewed and are negative.  Physical Exam   Blood pressure 127/79, pulse (!) 59, temperature 98.2 F (36.8 C), resp. rate 16, height 5\' 1"  (1.549 m), weight 62.1 kg, last menstrual period 02/03/2019, unknown if currently breastfeeding.  Physical Exam  Nursing note and vitals reviewed. Constitutional: She is oriented to person, place, and time. She  appears well-developed and well-nourished.  Cardiovascular: Normal rate.  Respiratory: Effort normal.  GI: Soft. She exhibits no distension. There is no abdominal tenderness. There is no rebound and no guarding.  Genitourinary:    Genitourinary Comments: Scant amount of thing vaginal discharge visible on SSE. Negative CMT on bimanual exam. No bleeding visualized.   Neurological: She is alert and oriented to person, place, and time.  Skin: Skin is warm and dry.  Psychiatric: She has a normal mood and affect. Her behavior is normal. Judgment and thought content normal.    MAU Course/MDM  Procedures: sterile speculum exam  --Hgb 8.9, patient asymptomatic, Discussed  starting PNV with iron and adding iron rich food or OTC iron supplement every second or third day --No concerning findings on physical exam or imaging  Patient Vitals for the past 24 hrs:  BP Temp Temp src Pulse Resp SpO2 Height Weight  05/01/19 2137 118/68 99.2 F (37.3 C) Oral 83 16 98 % - -  05/01/19 1912 116/73 - - (!) 59 - - - -  05/01/19 1811 127/79 98.2 F (36.8 C) - 66 16 - 5\' 1"  (1.549 m) 62.1 kg   Results for orders placed or performed during the hospital encounter of 05/01/19 (from the past 24 hour(s))  Pregnancy, urine POC     Status: Abnormal   Collection Time: 05/01/19  6:11 PM  Result Value Ref Range   Preg Test, Ur POSITIVE (A) NEGATIVE  Urinalysis, Routine w reflex microscopic     Status: Abnormal   Collection Time: 05/01/19  6:17 PM  Result Value Ref Range   Color, Urine YELLOW YELLOW   APPearance HAZY (A) CLEAR   Specific Gravity, Urine 1.024 1.005 - 1.030   pH 5.0 5.0 - 8.0   Glucose, UA NEGATIVE NEGATIVE mg/dL   Hgb urine dipstick NEGATIVE NEGATIVE   Bilirubin Urine NEGATIVE NEGATIVE   Ketones, ur 5 (A) NEGATIVE mg/dL   Protein, ur NEGATIVE NEGATIVE mg/dL   Nitrite NEGATIVE NEGATIVE   Leukocytes,Ua NEGATIVE NEGATIVE  Wet prep, genital     Status: Abnormal   Collection Time: 05/01/19  6:34 PM  Result Value Ref Range   Yeast Wet Prep HPF POC NONE SEEN NONE SEEN   Trich, Wet Prep NONE SEEN NONE SEEN   Clue Cells Wet Prep HPF POC PRESENT (A) NONE SEEN   WBC, Wet Prep HPF POC MANY (A) NONE SEEN   Sperm NONE SEEN   ABO/Rh     Status: None   Collection Time: 05/01/19  6:42 PM  Result Value Ref Range   ABO/RH(D)      O NEG Performed at [facility], [address] [place], [place] [address]   Rh IG workup (includes ABO/Rh)     Status: None (Preliminary result)   Collection Time: 05/01/19  6:42 PM  Result Value Ref Range   Gestational Age(Wks) 12    ABO/RH(D) O NEG    Antibody Screen POS    Antibody Identification      ANTI D Performed at [facility], [address] [place], [place] [address]    Unit Number [id number]    Blood Component Type RHIG    Unit division 00    Status of Unit ISSUED    Transfusion Status OK TO TRANSFUSE   CBC with Differential/Platelet     Status: Abnormal   Collection Time: 05/01/19  6:49 PM  Result Value Ref Range   WBC 7.5 4.0 - 10.5 K/uL  RBC 4.15 3.87 - 5.11 MIL/uL   Hemoglobin 8.9 (L) 12.0 - 15.0 g/dL   HCT 16.128.7 (L) 09.636.0 - 04.546.0 %   MCV 69.2 (L) 80.0 - 100.0 fL   MCH 21.4 (L) 26.0 - 34.0 pg   MCHC 31.0 30.0 - 36.0 g/dL   RDW 40.920.0 (H) 81.111.5 - 91.415.5 %   Platelets 275 150 - 400 K/uL   nRBC 0.0 0.0 - 0.2 %   Neutrophils Relative % 66 %   Neutro Abs 5.0 1.7 - 7.7 K/uL   Lymphocytes Relative 29 %   Lymphs Abs 2.2 0.7 - 4.0 K/uL   Monocytes Relative 3 %   Monocytes Absolute 0.2 0.1 - 1.0 K/uL   Eosinophils Relative 2 %   Eosinophils Absolute 0.2 0.0 - 0.5 K/uL   Basophils Relative 0 %   Basophils Absolute 0.0 0.0 - 0.1 K/uL   nRBC 1 (H) 0 /100 WBC   Abs Immature Granulocytes 0.00 0.00 - 0.07 K/uL  hCG, quantitative, pregnancy     Status: Abnormal   Collection Time: 05/01/19  6:49 PM  Result Value Ref Range   hCG, Beta Chain, Quant, S 133,428 (H) <5 mIU/mL   [place] Ob Comp Less 14 Wks  Result Date: 05/01/2019 CLINICAL DATA:  33 year old female with abdominal pain and spotting in the 1st trimester of pregnancy. Estimated gestational age by LMP 12 weeks 3 days. EXAM: OBSTETRIC <14 WK ULTRASOUND TECHNIQUE: Transabdominal ultrasound was performed for evaluation of the gestation as well as the maternal uterus and adnexal regions. COMPARISON:  None relevant FINDINGS: Intrauterine gestational sac: Single Yolk sac:  Not visible Embryo:  Visible Cardiac Activity: Detected Heart Rate: 157 bpm CRL:   67.2 mm   13 w 0 d                  US EDC: 11/06/2019 Subchorionic hemorrhage:  None visualized. Maternal uterus/adnexae: Normal right ovary measuring 4.2 x 2.9 x 2.1 centimeters. Normal left ovary  measuring 2.2 x 1.1 x 3.1 centimeters, containing a corpus luteum. No pelvic free fluid. IMPRESSION: Single living IUP demonstrated. No acute maternal findings visualized. Electronically Signed   By: Odessa FlemingH  Hall M.D.   On: 05/01/2019 20:06   Meds ordered this encounter  Medications  . rho (d) immune globulin (RHIG/RHOPHYLAC) injection 300 mcg  . Prenatal Vit-Fe Fumarate-FA (PREPLUS) 27-1 MG TABS    Sig: Take 1 tablet by mouth daily.    Dispense:  30 tablet    Refill:  13    Order Specific Question:   Supervising Provider    Answer:   Reva BoresPRATT, TANYA S [[id number]]  . iron polysaccharides (NIFEREX) 150 MG capsule    Sig: Take 1 capsule (150 mg total) by mouth every other day.    Dispense:  30 capsule    Refill:  6    Order Specific Question:   Supervising Provider    Answer:   Reva BoresPRATT, TANYA S [[id number]]  . metroNIDAZOLE (FLAGYL) 500 MG tablet    Sig: Take 1 tablet (500 mg total) by mouth 2 (two) times daily.    Dispense:  14 tablet    Refill:  0    Order Specific Question:   Supervising Provider    Answer:   Reva BoresPRATT, TANYA S [[id number]]    Assessment and Plan  --33 y.o. [id number] at 6945w0d by US performed today --O NEG, hx Anti D antibodies.Per Dr. Jolayne Pantheronstant consult, Rhogam given in MAU --Bacterial Vaginosis, rx to pharmacy --Discharge home in stable condition  V/U: Patient to establish prenatal care at her earliest convenience  Calvert CantorSamantha C Weinhold, [place] 05/01/2019, 9:55 PM

## 2019-05-02 ENCOUNTER — Encounter: Payer: Self-pay | Admitting: Advanced Practice Midwife

## 2019-05-02 DIAGNOSIS — Z3480 Encounter for supervision of other normal pregnancy, unspecified trimester: Secondary | ICD-10-CM | POA: Insufficient documentation

## 2019-05-02 DIAGNOSIS — O360190 Maternal care for anti-D [Rh] antibodies, unspecified trimester, not applicable or unspecified: Secondary | ICD-10-CM | POA: Insufficient documentation

## 2019-05-02 LAB — RH IG WORKUP (INCLUDES ABO/RH)
ABO/RH(D): O NEG
Antibody Screen: POSITIVE
Gestational Age(Wks): 12
Unit division: 0

## 2019-05-02 LAB — ABO/RH: ABO/RH(D): O NEG

## 2019-05-10 ENCOUNTER — Telehealth: Payer: Self-pay | Admitting: Family Medicine

## 2019-05-10 NOTE — Telephone Encounter (Signed)
Attempted to call patient about her appointment on 6/22 @ 3:15. No answer, left voicemail instructing patient to be sure she has the [employer] before her appointment. Instructed that she is needs any assistance getting the app to give the office a call back. Screening not completed due to it being a virtual appointment.

## 2019-05-13 ENCOUNTER — Other Ambulatory Visit: Payer: Self-pay

## 2019-05-13 ENCOUNTER — Ambulatory Visit: Payer: Medicaid Other | Admitting: General Practice

## 2019-05-13 DIAGNOSIS — Z3480 Encounter for supervision of other normal pregnancy, unspecified trimester: Secondary | ICD-10-CM

## 2019-05-13 MED ORDER — AMBULATORY NON FORMULARY MEDICATION: 1.0000 | Freq: Once | 0 refills | Status: AC

## 2019-05-13 NOTE — Progress Notes (Signed)
I connected with  [name] on 05/13/19 at  3:15 PM EDT by mychart video and verified that I am speaking with the correct person using two identifiers.   I discussed the limitations, risks, security and privacy concerns of performing an evaluation and management service by telephone and the availability of in person appointments. I also discussed with the patient that there may be a patient responsible charge related to this service. The patient expressed understanding and agreed to proceed.  New OB Intake Completed today. Scheduled Anatomy U/S 7/20 @ 12:45-pt informed. Patient enrolled in 52- does not have access to a cuff, will have one mailed to her. Future orders placed for OB labs. Pap not indicated at next appt- had at [facility]; called for results to be faxed. Hx significant for multiple TABs, molar pregnancy 03/2018, & anti-d antibodies per labs in MAU on 05/01/19. Patient has new OB appt scheduled with Dr Rip Harbour 6/29.  Derinda Late, RN  05/13/2019  4:23 PM

## 2019-05-15 ENCOUNTER — Telehealth (INDEPENDENT_AMBULATORY_CARE_PROVIDER_SITE_OTHER): Payer: Medicaid Other | Admitting: *Deleted

## 2019-05-15 DIAGNOSIS — R197 Diarrhea, unspecified: Secondary | ICD-10-CM

## 2019-05-15 DIAGNOSIS — R1110 Vomiting, unspecified: Secondary | ICD-10-CM

## 2019-05-15 NOTE — Telephone Encounter (Signed)
Pt called stating that she has started to have vomiting and diarrhea with cramping at the top of her stomach.  Recommended pt take imodium AD or Kaopectate for the diarrhea but to avoid Pepto Bismol and recommended she try bonine, dramamine, emetrol, ginger extract, meclizine, sea bands, or unisom with vitamin B6 to help with the vomiting.  Recommended that if pt could not keep anything down, including liquids for hydration, or if this lasts for more than 48 hours, she should go to MAU.  Mychart message sent to pt with medication recommendations.

## 2019-05-20 ENCOUNTER — Other Ambulatory Visit (HOSPITAL_COMMUNITY)
Admission: RE | Admit: 2019-05-20 | Discharge: 2019-05-20 | Disposition: A | Discharge status: Home / Self Care | Payer: Medicaid Other | Source: Ambulatory Visit | Attending: Obstetrics and Gynecology | Admitting: Obstetrics and Gynecology

## 2019-05-20 ENCOUNTER — Encounter: Payer: Self-pay | Admitting: Obstetrics and Gynecology

## 2019-05-20 ENCOUNTER — Other Ambulatory Visit: Payer: Self-pay

## 2019-05-20 ENCOUNTER — Ambulatory Visit (INDEPENDENT_AMBULATORY_CARE_PROVIDER_SITE_OTHER): Payer: Medicaid Other | Admitting: Obstetrics and Gynecology

## 2019-05-20 VITALS — BP 107/70 | HR 74 | Wt 137.2 lb

## 2019-05-20 DIAGNOSIS — O99019 Anemia complicating pregnancy, unspecified trimester: Secondary | ICD-10-CM | POA: Insufficient documentation

## 2019-05-20 DIAGNOSIS — Z3A15 15 weeks gestation of pregnancy: Secondary | ICD-10-CM

## 2019-05-20 DIAGNOSIS — O360120 Maternal care for anti-D [Rh] antibodies, second trimester, not applicable or unspecified: Secondary | ICD-10-CM

## 2019-05-20 DIAGNOSIS — Z8759 Personal history of other complications of pregnancy, childbirth and the puerperium: Secondary | ICD-10-CM | POA: Insufficient documentation

## 2019-05-20 DIAGNOSIS — Z3480 Encounter for supervision of other normal pregnancy, unspecified trimester: Secondary | ICD-10-CM

## 2019-05-20 DIAGNOSIS — O99012 Anemia complicating pregnancy, second trimester: Secondary | ICD-10-CM | POA: Diagnosis not present

## 2019-05-20 NOTE — Progress Notes (Signed)
Subjective:  Maria Bowman is a 33 y.o. [id number] at [redacted]w[redacted]d being seen today for first OB visit. EDD by first trimester U/S. History as noted.   She is currently monitored for the following issues for this high-risk pregnancy and has Supervision of other normal pregnancy, antepartum; Anti-D antibodies present during pregnancy; History of molar pregnancy; and Anemia affecting pregnancy on their problem list.  Patient reports no complaints.  Contractions: Not present. Vag. Bleeding: None.  Movement: Absent. Denies leaking of fluid.   The following portions of the patient's history were reviewed and updated as appropriate: allergies, current medications, past family history, past medical history, past social history, past surgical history and problem list. Problem list updated.  Objective:   Vitals:   05/20/19 0848  BP: 107/70  Pulse: 74  Weight: 137 lb 3.2 oz (62.2 kg)    Fetal Status: Fetal Heart Rate (bpm): 145   Movement: Absent     General:  Alert, oriented and cooperative. Patient is in no acute distress.  Skin: Skin is warm and dry. No rash noted.   Cardiovascular: Normal heart rate noted  Respiratory: Normal respiratory effort, no problems with respiration noted  Abdomen: Soft, gravid, appropriate for gestational age. Pain/Pressure: Present     Pelvic:  Cervical exam deferred        Extremities: Normal range of motion.  Edema: None  Mental Status: Normal mood and affect. Normal behavior. Normal judgment and thought content.   Urinalysis:      Assessment and Plan:  Pregnancy: [id number] at [redacted]w[redacted]d  1. Supervision of other normal pregnancy, antepartum Prenatal care and labs reviewed with pt Baby Rx reviewed, BP monitoring dicussed. Declined genetic testing - Culture, OB Urine - Obstetric Panel, Including HIV - Cervicovaginal ancillary only( [place])  2. History of molar pregnancy   3. Anti-D antibodies present during pregnancy in second trimester, single or unspecified  fetus S/P Rhogam 05/01/19  4. Anemia affecting pregnancy in second trimester Will check ferritin Continue with supplemental iron Discussed possible Feraheme infusion  Preterm labor symptoms and general obstetric precautions including but not limited to vaginal bleeding, contractions, leaking of fluid and fetal movement were reviewed in detail with the patient. Please refer to After Visit Summary for other counseling recommendations.  Return in about 4 weeks (around 06/17/2019) for OB visit, virtual.   Chancy Milroy, MD

## 2019-05-20 NOTE — Progress Notes (Signed)
Medicaid Home Form Completed-05/20/2019

## 2019-05-20 NOTE — Patient Instructions (Signed)

## 2019-05-21 LAB — AB SCR+ANTIBODY ID: Antibody Screen: POSITIVE — AB

## 2019-05-21 LAB — OBSTETRIC PANEL, INCLUDING HIV
Basophils Absolute: 0 10*3/uL (ref 0.0–0.2)
Basos: 0 %
EOS (ABSOLUTE): 0.2 10*3/uL (ref 0.0–0.4)
Eos: 3 %
HIV Screen 4th Generation wRfx: NONREACTIVE
Hematocrit: 31.7 % — ABNORMAL LOW (ref 34.0–46.6)
Hemoglobin: 10 g/dL — ABNORMAL LOW (ref 11.1–15.9)
Hepatitis B Surface Ag: NEGATIVE
Immature Grans (Abs): 0 10*3/uL (ref 0.0–0.1)
Immature Granulocytes: 0 %
Lymphocytes Absolute: 1.8 10*3/uL (ref 0.7–3.1)
Lymphs: 25 %
MCH: 22.9 pg — ABNORMAL LOW (ref 26.6–33.0)
MCHC: 31.5 g/dL (ref 31.5–35.7)
MCV: 73 fL — ABNORMAL LOW (ref 79–97)
Monocytes Absolute: 0.4 10*3/uL (ref 0.1–0.9)
Monocytes: 6 %
Neutrophils Absolute: 4.7 10*3/uL (ref 1.4–7.0)
Neutrophils: 66 %
Platelets: 269 10*3/uL (ref 150–450)
RBC: 4.36 x10E6/uL (ref 3.77–5.28)
RDW: 22.3 % — ABNORMAL HIGH (ref 11.7–15.4)
RPR Ser Ql: NONREACTIVE
Rh Factor: NEGATIVE
Rubella Antibodies, IGG: 1.41 index (ref >0.99)
WBC: 7.1 10*3/uL (ref 3.4–10.8)

## 2019-05-21 LAB — CERVICOVAGINAL ANCILLARY ONLY
Chlamydia: NEGATIVE
Neisseria Gonorrhea: NEGATIVE

## 2019-05-21 LAB — FERRITIN: Ferritin: 17 ng/mL (ref 15–150)

## 2019-05-22 ENCOUNTER — Other Ambulatory Visit: Payer: Self-pay | Admitting: Obstetrics and Gynecology

## 2019-05-22 DIAGNOSIS — O99012 Anemia complicating pregnancy, second trimester: Secondary | ICD-10-CM

## 2019-05-23 LAB — CULTURE, OB URINE

## 2019-05-23 LAB — URINE CULTURE, OB REFLEX

## 2019-05-29 ENCOUNTER — Encounter: Payer: Self-pay | Admitting: *Deleted

## 2019-06-10 ENCOUNTER — Other Ambulatory Visit: Payer: Self-pay

## 2019-06-10 ENCOUNTER — Other Ambulatory Visit (HOSPITAL_COMMUNITY): Payer: Self-pay | Admitting: *Deleted

## 2019-06-10 ENCOUNTER — Ambulatory Visit (HOSPITAL_COMMUNITY)
Admission: RE | Admit: 2019-06-10 | Discharge: 2019-06-10 | Disposition: A | Discharge status: Home / Self Care | Payer: Medicaid Other | Source: Ambulatory Visit | Attending: Obstetrics and Gynecology | Admitting: Obstetrics and Gynecology

## 2019-06-10 DIAGNOSIS — Z3480 Encounter for supervision of other normal pregnancy, unspecified trimester: Secondary | ICD-10-CM | POA: Insufficient documentation

## 2019-06-10 DIAGNOSIS — Z363 Encounter for antenatal screening for malformations: Secondary | ICD-10-CM | POA: Diagnosis not present

## 2019-06-10 DIAGNOSIS — Z3A18 18 weeks gestation of pregnancy: Secondary | ICD-10-CM

## 2019-06-10 DIAGNOSIS — Z362 Encounter for other antenatal screening follow-up: Secondary | ICD-10-CM

## 2019-06-10 DIAGNOSIS — O09522 Supervision of elderly multigravida, second trimester: Secondary | ICD-10-CM | POA: Diagnosis not present

## 2019-06-17 ENCOUNTER — Other Ambulatory Visit: Payer: Self-pay

## 2019-06-17 ENCOUNTER — Telehealth (INDEPENDENT_AMBULATORY_CARE_PROVIDER_SITE_OTHER): Payer: Medicaid Other | Admitting: Advanced Practice Midwife

## 2019-06-17 ENCOUNTER — Encounter: Payer: Self-pay | Admitting: Advanced Practice Midwife

## 2019-06-17 VITALS — BP 105/75 | Wt 142.0 lb

## 2019-06-17 DIAGNOSIS — Z3482 Encounter for supervision of other normal pregnancy, second trimester: Secondary | ICD-10-CM

## 2019-06-17 DIAGNOSIS — Z3480 Encounter for supervision of other normal pregnancy, unspecified trimester: Secondary | ICD-10-CM

## 2019-06-17 DIAGNOSIS — Z3A19 19 weeks gestation of pregnancy: Secondary | ICD-10-CM

## 2019-06-17 NOTE — Progress Notes (Signed)
0981 I called Poet for her virtual visit and heard a message " Your call cannot be completed at this time" Alicya Bena,RN I connected with  [name] on 06/17/19 at  9:15 AM EDT by telephone and verified that I am speaking with the correct person using two identifiers.   I discussed the limitations, risks, security and privacy concerns of performing an evaluation and management service by telephone and virtual and the availability of in person appointments. I also discussed with the patient that there may be a patient responsible charge related to this service. The patient expressed understanding and agreed to proceed.  Cyrah Mclamb,RN 06/17/2019  9:23 AM

## 2019-06-17 NOTE — Progress Notes (Signed)
   TELEHEALTH VIRTUAL OBSTETRICS VISIT ENCOUNTER NOTE  I connected with Maria Bowman on 06/17/19 at  9:15 AM EDT by telephone at home and verified that I am speaking with the correct person using two identifiers.   I discussed the limitations, risks, security and privacy concerns of performing an evaluation and management service by telephone and the availability of in person appointments. I also discussed with the patient that there may be a patient responsible charge related to this service. The patient expressed understanding and agreed to proceed.  Subjective:  Maria Bowman is a 33 y.o. [id number] at [redacted]w[redacted]d being followed for ongoing prenatal care.  She is currently monitored for the following issues for this low-risk pregnancy and has Supervision of other normal pregnancy, antepartum; Anti-D antibodies present during pregnancy; History of molar pregnancy; and Anemia affecting pregnancy on their problem list.  Patient reports no complaints. Reports fetal movement. Denies any contractions, bleeding or leaking of fluid.   The following portions of the patient's history were reviewed and updated as appropriate: allergies, current medications, past family history, past medical history, past social history, past surgical history and problem list.   Objective:   General:  Alert, oriented and cooperative.   Mental Status: Normal mood and affect perceived. Normal judgment and thought content.  Rest of physical exam deferred due to type of encounter  Assessment and Plan:  Pregnancy: [id number] at [redacted]w[redacted]d 1. Supervision of other normal pregnancy, antepartum - Routine care - CBC; Future - HIV Antibody (routine testing w rflx); Future - RPR; Future - Glucose Tolerance, 2 Hours w/1 Hour; Future - Antibody screen; Future - FU growth with MFM scheduled   Preterm labor symptoms and general obstetric precautions including but not limited to vaginal bleeding, contractions, leaking of fluid and fetal  movement were reviewed in detail with the patient.  I discussed the assessment and treatment plan with the patient. The patient was provided an opportunity to ask questions and all were answered. The patient agreed with the plan and demonstrated an understanding of the instructions. The patient was advised to call back or seek an in-person office evaluation/go to [facility]U at [facility] for any urgent or concerning symptoms. Please refer to After Visit Summary for other counseling recommendations.   I provided 15 minutes of non-face-to-face time during this encounter.  Return in about 8 weeks (around 08/12/2019) for 28 week labs and in person visit .  Future Appointments  Date Time Provider [place]  07/08/2019  3:45 PM [place] 2 WH-MFCUS MFC-US    Marcille Buffy DNP, CNM  06/17/19  9:26 AM  [facility]

## 2019-07-08 ENCOUNTER — Ambulatory Visit (HOSPITAL_COMMUNITY)
Admission: RE | Admit: 2019-07-08 | Discharge: 2019-07-08 | Disposition: A | Discharge status: Home / Self Care | Payer: Medicaid Other | Source: Ambulatory Visit | Attending: Obstetrics and Gynecology | Admitting: Obstetrics and Gynecology

## 2019-07-08 ENCOUNTER — Other Ambulatory Visit: Payer: Self-pay

## 2019-07-08 DIAGNOSIS — Z362 Encounter for other antenatal screening follow-up: Secondary | ICD-10-CM | POA: Diagnosis not present

## 2019-07-08 DIAGNOSIS — Z3A22 22 weeks gestation of pregnancy: Secondary | ICD-10-CM

## 2019-07-08 DIAGNOSIS — O09522 Supervision of elderly multigravida, second trimester: Secondary | ICD-10-CM | POA: Diagnosis not present

## 2019-08-12 ENCOUNTER — Encounter: Payer: Self-pay | Admitting: Advanced Practice Midwife

## 2019-08-12 ENCOUNTER — Other Ambulatory Visit (HOSPITAL_COMMUNITY)
Admission: RE | Admit: 2019-08-12 | Discharge: 2019-08-12 | Disposition: A | Discharge status: Home / Self Care | Payer: Medicaid Other | Source: Ambulatory Visit | Attending: Advanced Practice Midwife | Admitting: Advanced Practice Midwife

## 2019-08-12 ENCOUNTER — Other Ambulatory Visit: Payer: Self-pay | Admitting: Advanced Practice Midwife

## 2019-08-12 ENCOUNTER — Ambulatory Visit (INDEPENDENT_AMBULATORY_CARE_PROVIDER_SITE_OTHER): Payer: Medicaid Other | Admitting: Advanced Practice Midwife

## 2019-08-12 ENCOUNTER — Other Ambulatory Visit: Payer: Self-pay

## 2019-08-12 ENCOUNTER — Other Ambulatory Visit: Payer: Medicaid Other

## 2019-08-12 VITALS — BP 107/75 | HR 75 | Wt 152.9 lb

## 2019-08-12 DIAGNOSIS — Z3A27 27 weeks gestation of pregnancy: Secondary | ICD-10-CM

## 2019-08-12 DIAGNOSIS — O360120 Maternal care for anti-D [Rh] antibodies, second trimester, not applicable or unspecified: Secondary | ICD-10-CM

## 2019-08-12 DIAGNOSIS — Z3480 Encounter for supervision of other normal pregnancy, unspecified trimester: Secondary | ICD-10-CM | POA: Diagnosis present

## 2019-08-12 DIAGNOSIS — Z23 Encounter for immunization: Secondary | ICD-10-CM | POA: Diagnosis not present

## 2019-08-12 MED ORDER — RHO D IMMUNE GLOBULIN 1500 UNIT/2ML IJ SOSY
300.0000 ug | PREFILLED_SYRINGE | Freq: Once | INTRAMUSCULAR | Status: AC
  Administered 2019-08-12: 300 ug via INTRAMUSCULAR

## 2019-08-12 NOTE — Progress Notes (Signed)
   PRENATAL VISIT NOTE  Subjective:  Maria Bowman is a 33 y.o. [id number] at [redacted]w[redacted]d being seen today for ongoing prenatal care.  She is currently monitored for the following issues for this low-risk pregnancy and has Supervision of other normal pregnancy, antepartum; Anti-D antibodies present during pregnancy; History of molar pregnancy; and Anemia affecting pregnancy on their problem list.  Patient reports vaginal irritation/irritation and discharge x 5-6 days. She states that it started after intercourse.  Contractions: Not present. Vag. Bleeding: None.  Movement: Present. Denies leaking of fluid.   The following portions of the patient's history were reviewed and updated as appropriate: allergies, current medications, past family history, past medical history, past social history, past surgical history and problem list.   Objective:   Vitals:   08/12/19 0848  BP: 107/75  Pulse: 75  Weight: 152 lb 14.4 oz (69.4 kg)    Fetal Status: Fetal Heart Rate (bpm): 148 Fundal Height: 28 cm Movement: Present     General:  Alert, oriented and cooperative. Patient is in no acute distress.  Skin: Skin is warm and dry. No rash noted.   Cardiovascular: Normal heart rate noted  Respiratory: Normal respiratory effort, no problems with respiration noted  Abdomen: Soft, gravid, appropriate for gestational age.  Pain/Pressure: Absent     Pelvic: Cervical exam deferred        Extremities: Normal range of motion.  Edema: None  Mental Status: Normal mood and affect. Normal behavior. Normal judgment and thought content.   Assessment and Plan:  Pregnancy: [id number] at [redacted]w[redacted]d 1. Supervision of other normal pregnancy, antepartum - Routine care - lab visit for GTT scheduled. Patient is not fasting today.  - rho (d) immune globulin (RHIG/RHOPHYLAC) injection 300 mcg - Tdap vaccine greater than or equal to 7yo IM - Cervicovaginal ancillary only( [place])  Preterm labor symptoms and general obstetric  precautions including but not limited to vaginal bleeding, contractions, leaking of fluid and fetal movement were reviewed in detail with the patient. Please refer to After Visit Summary for other counseling recommendations.   Return in about 2 weeks (around 08/26/2019) for virtual visit .  Future Appointments  Date Time Provider [place]  08/20/2019  8:20 AM [facility] LAB [facility] WOC   [place] DNP, [place]  08/12/19  9:15 AM

## 2019-08-13 LAB — CERVICOVAGINAL ANCILLARY ONLY
Bacterial Vaginitis (gardnerella): NEGATIVE
Candida Glabrata: NEGATIVE
Candida Vaginitis: NEGATIVE
Molecular Disclaimer: NEGATIVE
Molecular Disclaimer: NEGATIVE
Molecular Disclaimer: NEGATIVE
Molecular Disclaimer: NORMAL
Trichomonas: POSITIVE — AB

## 2019-08-13 LAB — HIV ANTIBODY (ROUTINE TESTING W REFLEX): HIV Screen 4th Generation wRfx: NONREACTIVE

## 2019-08-13 LAB — CBC
Hematocrit: 32.9 % — ABNORMAL LOW (ref 34.0–46.6)
Hemoglobin: 11.5 g/dL (ref 11.1–15.9)
MCH: 28 pg (ref 26.6–33.0)
MCHC: 35 g/dL (ref 31.5–35.7)
MCV: 80 fL (ref 79–97)
Platelets: 188 10*3/uL (ref 150–450)
RBC: 4.11 x10E6/uL (ref 3.77–5.28)
RDW: 15.9 % — ABNORMAL HIGH (ref 11.7–15.4)
WBC: 7.4 10*3/uL (ref 3.4–10.8)

## 2019-08-13 LAB — SYPHILIS: RPR W/REFLEX TO RPR TITER AND TREPONEMAL ANTIBODIES, TRADITIONAL SCREENING AND DIAGNOSIS ALGORITHM: RPR Ser Ql: NONREACTIVE

## 2019-08-13 LAB — AB SCR+ANTIBODY ID: Antibody Screen: POSITIVE — AB

## 2019-08-13 LAB — ANTIBODY SCREEN

## 2019-08-13 MED ORDER — METRONIDAZOLE 500 MG PO TABS: 2000.0000 mg | ORAL_TABLET | Freq: Once | ORAL | 0 refills | Status: AC

## 2019-08-13 NOTE — Addendum Note (Signed)
Addended by: Marcille Buffy D on: 08/13/2019 01:13 PM   Modules accepted: Orders

## 2019-08-14 LAB — CERVICOVAGINAL ANCILLARY ONLY
Chlamydia: NEGATIVE
Neisseria Gonorrhea: NEGATIVE

## 2019-08-16 ENCOUNTER — Encounter: Payer: Self-pay | Admitting: *Deleted

## 2019-08-19 ENCOUNTER — Other Ambulatory Visit: Payer: Self-pay | Admitting: *Deleted

## 2019-08-19 ENCOUNTER — Telehealth: Payer: Self-pay | Admitting: Family Medicine

## 2019-08-19 DIAGNOSIS — Z3480 Encounter for supervision of other normal pregnancy, unspecified trimester: Secondary | ICD-10-CM

## 2019-08-19 NOTE — Telephone Encounter (Signed)
Spoke to patient about her appointment on 9/29 @ 8:20. Patient instructed to wear a face mask for the entire appointment and no visitors are allowed with her during the visit. Patient screened for covid symptoms and denied having any. Patient instructed to come fasting.

## 2019-08-19 NOTE — Addendum Note (Signed)
Addended by: Samuel Germany on: 08/19/2019 11:33 AM   Modules accepted: Orders

## 2019-08-20 ENCOUNTER — Other Ambulatory Visit: Payer: Self-pay

## 2019-08-20 ENCOUNTER — Other Ambulatory Visit: Payer: Medicaid Other

## 2019-08-20 DIAGNOSIS — Z3480 Encounter for supervision of other normal pregnancy, unspecified trimester: Secondary | ICD-10-CM

## 2019-08-21 LAB — GLUCOSE TOLERANCE, 2 HOURS W/ 1HR
Glucose, 1 hour: 152 mg/dL (ref 65–179)
Glucose, 2 hour: 133 mg/dL (ref 65–152)
Glucose, Fasting: 76 mg/dL (ref 65–91)

## 2019-08-26 ENCOUNTER — Other Ambulatory Visit: Payer: Self-pay

## 2019-08-26 ENCOUNTER — Telehealth (INDEPENDENT_AMBULATORY_CARE_PROVIDER_SITE_OTHER): Payer: Medicaid Other | Admitting: Certified Nurse Midwife

## 2019-08-26 ENCOUNTER — Encounter: Payer: Self-pay | Admitting: Certified Nurse Midwife

## 2019-08-26 ENCOUNTER — Telehealth: Payer: Medicaid Other | Admitting: Certified Nurse Midwife

## 2019-08-26 DIAGNOSIS — O360130 Maternal care for anti-D [Rh] antibodies, third trimester, not applicable or unspecified: Secondary | ICD-10-CM

## 2019-08-26 DIAGNOSIS — Z3480 Encounter for supervision of other normal pregnancy, unspecified trimester: Secondary | ICD-10-CM

## 2019-08-26 DIAGNOSIS — Z3A29 29 weeks gestation of pregnancy: Secondary | ICD-10-CM

## 2019-08-26 DIAGNOSIS — O99013 Anemia complicating pregnancy, third trimester: Secondary | ICD-10-CM

## 2019-08-26 NOTE — Progress Notes (Signed)
   [facility] VIRTUAL VIDEO VISIT ENCOUNTER NOTE  Provider location: [facility] [place] connected with [name] on 08/26/19 at  8:35 AM EDT by MyChart Video Encounter at home and verified that I am speaking with the correct person using two identifiers.   I discussed the limitations, risks, security and privacy concerns of performing an evaluation and management service virtually and the availability of in person appointments. I also discussed with the patient that there may be a patient responsible charge related to this service. The patient expressed understanding and agreed to proceed. Subjective:  Maria Bowman is a 33 y.o. [id number] at [redacted]w[redacted]d being seen today for ongoing prenatal care.  She is currently monitored for the following issues for this low-risk pregnancy and has Supervision of other normal pregnancy, antepartum; Anti-D antibodies present during pregnancy; History of molar pregnancy; and Anemia affecting pregnancy on their problem list.  Patient reports no complaints.  Contractions: Not present.  .  Movement: Present. Denies any leaking of fluid.   The following portions of the patient's history were reviewed and updated as appropriate: allergies, current medications, past family history, past medical history, past social history, past surgical history and problem list.   Objective:  There were no vitals filed for this visit.  Fetal Status:     Movement: Present     General:  Alert, oriented and cooperative. Patient is in no acute distress.  Respiratory: Normal respiratory effort, no problems with respiration noted  Mental Status: Normal mood and affect. Normal behavior. Normal judgment and thought content.  Rest of physical exam deferred due to type of encounter  Imaging: No results found.  Assessment and Plan:  Pregnancy: [id number] at [redacted]w[redacted]d 1. Supervision of other normal pregnancy, antepartum - Patient doing okay, reports  doing well pregnancy wise but is going through some personal concerns  - Routine prenatal care - Anticipatory guidance on upcoming appointments  - RH negative status in pregnancy, patient received rhogam on 9/21 in office  2. Anti-D antibodies present during pregnancy in third trimester, single or unspecified fetus - Antibody screen positive for anti D, level weak unable to titer   3. Anemia affecting pregnancy in third trimester - Continue taking iron supplementation daily  - hgb on 9/21 11.5  Preterm labor symptoms and general obstetric precautions including but not limited to vaginal bleeding, contractions, leaking of fluid and fetal movement were reviewed in detail with the patient. I discussed the assessment and treatment plan with the patient. The patient was provided an opportunity to ask questions and all were answered. The patient agreed with the plan and demonstrated an understanding of the instructions. The patient was advised to call back or seek an in-person office evaluation/go to MAU at [facility] for any urgent or concerning symptoms. Please refer to After Visit Summary for other counseling recommendations.   I provided 10 minutes of face-to-face time during this encounter.  Return in about 3 weeks (around 09/16/2019) for ROB-LROB virtual .  Future Appointments  Date Time Provider [place]  09/16/2019 10:35 AM Nugent, Gerrie Nordmann, NP [place], [place] for [employer], [place]

## 2019-09-15 NOTE — Progress Notes (Signed)
I connected with Maria Bowman on 09/16/19 at 10:35 AM EDT by: MyChart Video and verified that I am speaking with the correct person using two identifiers.  Patient is located at home and provider is located at [facility] clinic.     The purpose of this virtual visit is to provide medical care while limiting exposure to the novel coronavirus. I discussed the limitations, risks, security and privacy concerns of performing an evaluation and management service by MyChart Video and the availability of in person appointments. I also discussed with the patient that there may be a patient responsible charge related to this service. By engaging in this virtual visit, you consent to the provision of healthcare.  Additionally, you authorize for your insurance to be billed for the services provided during this visit.  The patient expressed understanding and agreed to proceed.  The following staff members participated in the virtual visit:  Vernice Jefferson, NP    PRENATAL VISIT NOTE  Subjective:  Maria Bowman is a 33 y.o. [id number] at [redacted]w[redacted]d  for [place] visit for ongoing prenatal care.  She is currently monitored for the following issues for this low-risk pregnancy and has Supervision of other normal pregnancy, antepartum; Anti-D antibodies present during pregnancy; History of molar pregnancy; and Anemia affecting pregnancy on their problem list.  Patient reports occasional contractions.  Contractions: Irritability. Vag. Bleeding: None.  Movement: Present. Denies leaking of fluid.   The following portions of the patient's history were reviewed and updated as appropriate: allergies, current medications, past family history, past medical history, past social history, past surgical history and problem list.   Objective:  There were no vitals filed for this visit. Pt unable to take BP as she is currently at work, pt advised to take when she gets home and call office with result ASAP.  Fetal Status:     Movement:  Present     Assessment and Plan:  Pregnancy: [id number] at [redacted]w[redacted]d  1. Supervision of other normal pregnancy, antepartum -pt advised she can come to clinic at any time for flu vaccine -pt had Tdap on 08/12/2019  2. Anti-D antibodies present during pregnancy in third trimester, single or unspecified fetus -pt received RhoGAM for bleeding in early pregnancy and again on 08/12/2019  3. 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 Hulan Fray, nothing to do for patient at this time  4. Anemia affecting pregnancy in third trimester -hgb 11.5 on 08/12/2019 -pt to continue taking iron, as prescribed  Preterm labor symptoms and general obstetric precautions including but not limited to vaginal bleeding, contractions, leaking of fluid and fetal movement were reviewed in detail with the patient.  Return in about 3 weeks (around 10/07/2019) for in-person, needs GBS.  No future appointments.  Time spent on virtual visit: 15 minutes  Clarisa Fling, NP

## 2019-09-16 ENCOUNTER — Telehealth (INDEPENDENT_AMBULATORY_CARE_PROVIDER_SITE_OTHER): Payer: Medicaid Other | Admitting: Women's Health

## 2019-09-16 ENCOUNTER — Other Ambulatory Visit: Payer: Self-pay

## 2019-09-16 ENCOUNTER — Encounter: Payer: Self-pay | Admitting: Women's Health

## 2019-09-16 DIAGNOSIS — Z8759 Personal history of other complications of pregnancy, childbirth and the puerperium: Secondary | ICD-10-CM

## 2019-09-16 DIAGNOSIS — Z3A32 32 weeks gestation of pregnancy: Secondary | ICD-10-CM

## 2019-09-16 DIAGNOSIS — O99013 Anemia complicating pregnancy, third trimester: Secondary | ICD-10-CM

## 2019-09-16 DIAGNOSIS — O09293 Supervision of pregnancy with other poor reproductive or obstetric history, third trimester: Secondary | ICD-10-CM

## 2019-09-16 DIAGNOSIS — O360130 Maternal care for anti-D [Rh] antibodies, third trimester, not applicable or unspecified: Secondary | ICD-10-CM

## 2019-09-16 DIAGNOSIS — Z3480 Encounter for supervision of other normal pregnancy, unspecified trimester: Secondary | ICD-10-CM

## 2019-09-16 NOTE — Progress Notes (Signed)
Pt states is at work & does not have BP Cuff, asked if she can take once she gets home & record in [place]. Pt verbalized understanding.

## 2019-09-16 NOTE — Patient Instructions (Signed)
Preterm Labor and Birth Information ° °The normal length of a pregnancy is 39-41 weeks. Preterm labor is when labor starts before 37 completed weeks of pregnancy. °What are the risk factors for preterm labor? °Preterm labor is more likely to occur in women who: °· Have certain infections during pregnancy such as a bladder infection, sexually transmitted infection, or infection inside the uterus (chorioamnionitis). °· Have a shorter-than-normal cervix. °· Have gone into preterm labor before. °· Have had surgery on their cervix. °· Are younger than age 17 or older than age 35. °· Are African American. °· Are pregnant with twins or multiple babies (multiple gestation). °· Take street drugs or smoke while pregnant. °· Do not gain enough weight while pregnant. °· Became pregnant shortly after having been pregnant. °What are the symptoms of preterm labor? °Symptoms of preterm labor include: °· Cramps similar to those that can happen during a menstrual period. The cramps may happen with diarrhea. °· Pain in the abdomen or lower back. °· Regular uterine contractions that may feel like tightening of the abdomen. °· A feeling of increased pressure in the pelvis. °· Increased watery or bloody mucus discharge from the vagina. °· Water breaking (ruptured amniotic sac). °Why is it important to recognize signs of preterm labor? °It is important to recognize signs of preterm labor because babies who are born prematurely may not be fully developed. This can put them at an increased risk for: °· Long-term (chronic) heart and lung problems. °· Difficulty immediately after birth with regulating body systems, including blood sugar, body temperature, heart rate, and breathing rate. °· Bleeding in the brain. °· Cerebral palsy. °· Learning difficulties. °· Death. °These risks are highest for babies who are born before 34 weeks of pregnancy. °How is preterm labor treated? °Treatment depends on the length of your pregnancy, your condition,  and the health of your baby. It may involve: °· Having a stitch (suture) placed in your cervix to prevent your cervix from opening too early (cerclage). °· Taking or being given medicines, such as: °? Hormone medicines. These may be given early in pregnancy to help support the pregnancy. °? Medicine to stop contractions. °? Medicines to help mature the baby’s lungs. These may be prescribed if the risk of delivery is high. °? Medicines to prevent your baby from developing cerebral palsy. °If the labor happens before 34 weeks of pregnancy, you may need to stay in the hospital. °What should I do if I think I am in preterm labor? °If you think that you are going into preterm labor, call your health care provider right away. °How can I prevent preterm labor in future pregnancies? °To increase your chance of having a full-term pregnancy: °· Do not use any tobacco products, such as cigarettes, chewing tobacco, and e-cigarettes. If you need help quitting, ask your health care provider. °· Do not use street drugs or medicines that have not been prescribed to you during your pregnancy. °· Talk with your health care provider before taking any herbal supplements, even if you have been taking them regularly. °· Make sure you gain a healthy amount of weight during your pregnancy. °· Watch for infection. If you think that you might have an infection, get it checked right away. °· Make sure to tell your health care provider if you have gone into preterm labor before. °This information is not intended to replace advice given to you by your health care provider. Make sure you discuss any questions you have with your   health care provider. Document Released: 01/28/2004 Document Revised: 03/01/2019 Document Reviewed: 03/30/2016  The Maternity Assessment Unit (MAU) is located at the [facility] and [place] at [facility]. The address is: [address] [place], [place], [place] [address]. Please see map below for  additional directions.    The Maternity Assessment Unit is designed to help you during your pregnancy, and for up to 6 weeks after delivery, with any pregnancy- or postpartum-related emergencies, if you think you are in labor, or if your water has broken. For example, if you experience nausea and vomiting, vaginal bleeding, severe abdominal or pelvic pain, elevated blood pressure or other problems related to your pregnancy or postpartum time, please come to the Maternity Assessment Unit for assistance.  Elsevier Patient Education  [employer].

## 2019-09-16 NOTE — Progress Notes (Signed)
I connected with  [name] on 09/16/19 at 10:35 AM EDT by telephone and verified that I am speaking with the correct person using two identifiers.   I discussed the limitations, risks, security and privacy concerns of performing an evaluation and management service by telephone and the availability of in person appointments. I also discussed with the patient that there may be a patient responsible charge related to this service. The patient expressed understanding and agreed to proceed.  [name], CMA 09/16/2019  10:30 AM

## 2019-09-24 ENCOUNTER — Telehealth: Payer: Self-pay | Admitting: Family Medicine

## 2019-09-24 NOTE — Telephone Encounter (Signed)
patient state she may have leaked fluids. States she went to check and there was no fluids, but felt like she had to pee when she took a step. State she is having some increased pelvic pressure.

## 2019-09-24 NOTE — Telephone Encounter (Signed)
Patient stated that she feels as if she was leaking fluids and there was nothing there. Patient asked if it was okay for her to continue to work but is requesting to come in sooner. Scheduled for Thursday @1115am  with [place].

## 2019-09-26 ENCOUNTER — Other Ambulatory Visit: Payer: Self-pay

## 2019-09-26 ENCOUNTER — Ambulatory Visit (INDEPENDENT_AMBULATORY_CARE_PROVIDER_SITE_OTHER): Payer: Medicaid Other | Admitting: Obstetrics & Gynecology

## 2019-09-26 ENCOUNTER — Encounter: Payer: Self-pay | Admitting: Family Medicine

## 2019-09-26 ENCOUNTER — Encounter: Payer: Self-pay | Admitting: Obstetrics & Gynecology

## 2019-09-26 VITALS — BP 111/75 | HR 75 | Wt 156.5 lb

## 2019-09-26 DIAGNOSIS — Z3A34 34 weeks gestation of pregnancy: Secondary | ICD-10-CM

## 2019-09-26 DIAGNOSIS — Z3480 Encounter for supervision of other normal pregnancy, unspecified trimester: Secondary | ICD-10-CM

## 2019-09-26 DIAGNOSIS — O99013 Anemia complicating pregnancy, third trimester: Secondary | ICD-10-CM

## 2019-09-26 NOTE — Progress Notes (Signed)
   PRENATAL VISIT NOTE  Subjective:  Maria Bowman is a 33 y.o. [id number] at [redacted]w[redacted]d being seen today for ongoing prenatal care.  She is currently monitored for the following issues for this low-risk pregnancy and has Supervision of other normal pregnancy, antepartum; Anti-D antibodies present during pregnancy; History of molar pregnancy; and Anemia affecting pregnancy on their problem list.  Patient reports no complaints.   .  .  Movement: Present. Denies leaking of fluid.   The following portions of the patient's history were reviewed and updated as appropriate: allergies, current medications, past family history, past medical history, past social history, past surgical history and problem list.   Objective:   Vitals:   09/26/19 1130  Weight: 156 lb 8 oz (71 kg)    Fetal Status:     Movement: Present     General:  Alert, oriented and cooperative. Patient is in no acute distress.  Skin: Skin is warm and dry. No rash noted.   Cardiovascular: Normal heart rate noted  Respiratory: Normal respiratory effort, no problems with respiration noted  Abdomen: Soft, gravid, appropriate for gestational age.  Pain/Pressure: Present     Pelvic: Cervical exam deferred        Extremities: Normal range of motion.  Edema: None  Mental Status: Normal mood and affect. Normal behavior. Normal judgment and thought content.   Assessment and Plan:  Pregnancy: [id number] at [redacted]w[redacted]d 1. Supervision of other normal pregnancy, antepartum   2. Anemia affecting pregnancy in third trimester - continue iron daily  Preterm labor symptoms and general obstetric precautions including but not limited to vaginal bleeding, contractions, leaking of fluid and fetal movement were reviewed in detail with the patient. Please refer to After Visit Summary for other counseling recommendations.   No follow-ups on file.  Future Appointments  Date Time Provider [place]  10/07/2019  9:15 AM Burleson, Rona Ravens, NP [facility]  [facility]    Emily Filbert, MD

## 2019-10-07 ENCOUNTER — Other Ambulatory Visit: Payer: Self-pay

## 2019-10-07 ENCOUNTER — Ambulatory Visit (INDEPENDENT_AMBULATORY_CARE_PROVIDER_SITE_OTHER): Payer: Medicaid Other | Admitting: Nurse Practitioner

## 2019-10-07 ENCOUNTER — Other Ambulatory Visit (HOSPITAL_COMMUNITY)
Admission: RE | Admit: 2019-10-07 | Discharge: 2019-10-07 | Disposition: A | Discharge status: Home / Self Care | Payer: Medicaid Other | Source: Ambulatory Visit | Attending: Nurse Practitioner | Admitting: Nurse Practitioner

## 2019-10-07 VITALS — BP 123/77 | HR 62 | Wt 159.5 lb

## 2019-10-07 DIAGNOSIS — Z3480 Encounter for supervision of other normal pregnancy, unspecified trimester: Secondary | ICD-10-CM

## 2019-10-07 DIAGNOSIS — Z3A35 35 weeks gestation of pregnancy: Secondary | ICD-10-CM

## 2019-10-07 DIAGNOSIS — Z3483 Encounter for supervision of other normal pregnancy, third trimester: Secondary | ICD-10-CM

## 2019-10-07 NOTE — Progress Notes (Signed)
    Subjective:  Maria Bowman is a 33 y.o. [id number] at [redacted]w[redacted]d being seen today for ongoing prenatal care.  She is currently monitored for the following issues for this low-risk pregnancy and has Supervision of other normal pregnancy, antepartum; Anti-D antibodies present during pregnancy; History of molar pregnancy; and Anemia affecting pregnancy on their problem list.  Patient reports no leaking - did have one episode of increased discharge over a week ago but no continued leaking and she does not think that her water broke.  Contractions: Irritability. Vag. Bleeding: None.  Movement: Present. Denies leaking of fluid.   The following portions of the patient's history were reviewed and updated as appropriate: allergies, current medications, past family history, past medical history, past social history, past surgical history and problem list. Problem list updated.  Objective:   Vitals:   10/07/19 0935  BP: 123/77  Pulse: 62  Weight: 159 lb 8 oz (72.3 kg)    Fetal Status: Fetal Heart Rate (bpm): 152 Fundal Height: 36 cm Movement: Present     General:  Alert, oriented and cooperative. Patient is in no acute distress.  Skin: Skin is warm and dry. No rash noted.   Cardiovascular: Normal heart rate noted  Respiratory: Normal respiratory effort, no problems with respiration noted  Abdomen: Soft, gravid, appropriate for gestational age. Pain/Pressure: Present     Pelvic:  Cervical exam deferred        Extremities: Normal range of motion.  Edema: None  Mental Status: Normal mood and affect. Normal behavior. Normal judgment and thought content.  Perineum dry - no evidence of leaking seen  Urinalysis:      Assessment and Plan:  Pregnancy: [id number] at [redacted]w[redacted]d  1. Supervision of other normal pregnancy, antepartum Client is [redacted]w[redacted]d today and was scheduled for in person visit for 36 week labs - will do these today as client is 2 days away from usual time frame. Next visit will be virtual and in  person at 58 weeks. Baby moving well Having some irregular contractions Continues to decline flu vaccine Encouraged to continue to document weekly BP in babyscripts Instructed to call the office with any leaking to be evaluted for ROM  - Culture, beta strep (group b only) - GC/Chlamydia Probe Amp  Preterm labor symptoms and general obstetric precautions including but not limited to vaginal bleeding, contractions, leaking of fluid and fetal movement were reviewed in detail with the patient. Please refer to After Visit Summary for other counseling recommendations.  Return in about 1 week (around 10/14/2019) for virtual visit.  Earlie Server, RN, MSN, NP-BC Nurse Practitioner, [facility] for [employer], [place] [facility] 10/07/2019 10:07 AM

## 2019-10-07 NOTE — Patient Instructions (Signed)

## 2019-10-08 LAB — GC/CHLAMYDIA PROBE AMP (~~LOC~~) NOT AT ARMC
Chlamydia: NEGATIVE
Comment: NEGATIVE
Comment: NORMAL
Neisseria Gonorrhea: NEGATIVE

## 2019-10-10 ENCOUNTER — Encounter: Payer: Self-pay | Admitting: Nurse Practitioner

## 2019-10-10 DIAGNOSIS — O99820 Streptococcus B carrier state complicating pregnancy: Secondary | ICD-10-CM | POA: Insufficient documentation

## 2019-10-10 LAB — CULTURE, BETA STREP (GROUP B ONLY): Strep Gp B Culture: POSITIVE — AB

## 2019-10-14 ENCOUNTER — Telehealth (INDEPENDENT_AMBULATORY_CARE_PROVIDER_SITE_OTHER): Payer: Medicaid Other | Admitting: Medical

## 2019-10-14 ENCOUNTER — Encounter: Payer: Self-pay | Admitting: Medical

## 2019-10-14 ENCOUNTER — Other Ambulatory Visit: Payer: Self-pay

## 2019-10-14 DIAGNOSIS — Z3A36 36 weeks gestation of pregnancy: Secondary | ICD-10-CM | POA: Diagnosis not present

## 2019-10-14 DIAGNOSIS — Z3480 Encounter for supervision of other normal pregnancy, unspecified trimester: Secondary | ICD-10-CM

## 2019-10-14 DIAGNOSIS — O99820 Streptococcus B carrier state complicating pregnancy: Secondary | ICD-10-CM | POA: Diagnosis not present

## 2019-10-14 DIAGNOSIS — O99013 Anemia complicating pregnancy, third trimester: Secondary | ICD-10-CM | POA: Diagnosis not present

## 2019-10-14 NOTE — Progress Notes (Signed)
Pt is at work, does not have BP Cuff, asked if she could take & record in [facility] when she gets home.Pt concerned of GBS being +. Can Braxton hicks be harmful to the baby.

## 2019-10-14 NOTE — Progress Notes (Signed)
   TELEHEALTH VIRTUAL OBSTETRICS VISIT ENCOUNTER NOTE  I connected with [name] on 10/14/19 at  2:15 PM EST by telephone at home and verified that I am speaking with the correct person using two identifiers.   I discussed the limitations, risks, security and privacy concerns of performing an evaluation and management service by telephone and the availability of in person appointments. I also discussed with the patient that there may be a patient responsible charge related to this service. The patient expressed understanding and agreed to proceed.  Subjective:  Maria Bowman is a 32 y.o. [id number] at [redacted]w[redacted]d being followed for ongoing prenatal care.  She is currently monitored for the following issues for this low-risk pregnancy and has Supervision of other normal pregnancy, antepartum; Anti-D antibodies present during pregnancy; History of molar pregnancy; Anemia affecting pregnancy; and Group B Streptococcus carrier state affecting pregnancy on their problem list.  Patient reports occasional contractions. Reports fetal movement. Denies any contractions, bleeding or leaking of fluid.   The following portions of the patient's history were reviewed and updated as appropriate: allergies, current medications, past family history, past medical history, past social history, past surgical history and problem list.   Objective:   General:  Alert, oriented and cooperative.   Mental Status: Normal mood and affect perceived. Normal judgment and thought content.  Rest of physical exam deferred due to type of encounter  Assessment and Plan:  Pregnancy: [id number] at [redacted]w[redacted]d 1. Supervision of other normal pregnancy, antepartum - Doing well, few BH contractions  2. Group B Streptococcus carrier state affecting pregnancy - Discussed need for treatment in labor   3. Anemia affecting pregnancy in third trimester - Normal Hgb at last check   Preterm labor symptoms and general obstetric precautions including  but not limited to vaginal bleeding, contractions, leaking of fluid and fetal movement were reviewed in detail with the patient.  I discussed the assessment and treatment plan with the patient. The patient was provided an opportunity to ask questions and all were answered. The patient agreed with the plan and demonstrated an understanding of the instructions. The patient was advised to call back or seek an in-person office evaluation/go to MAU at [facility] for any urgent or concerning symptoms. Please refer to After Visit Summary for other counseling recommendations.   I provided 10 minutes of non-face-to-face time during this encounter.  Return in about 1 week (around 10/21/2019) for LOB, In-Person.  No future appointments.  Kerry Hough, PA-C Center for [employer], [place]

## 2019-10-14 NOTE — Progress Notes (Signed)
2:16p-Called pt for My Chart visit, no naswer, rcvd message, Your call can not be completed@ this time, please call again later.   2:26p- 2nd attempt, still no answer & not able to leave a VM, getting same message, please try again later.   I connected with  Maria Bowman on 10/14/19 at  2:15 PM EST by telephone and verified that I am speaking with the correct person using two identifiers.   I discussed the limitations, risks, security and privacy concerns of performing an evaluation and management service by telephone and the availability of in person appointments. I also discussed with the patient that there may be a patient responsible charge related to this service. The patient expressed understanding and agreed to proceed.  [name], [place] 10/14/2019  2:41 PM

## 2019-10-14 NOTE — Patient Instructions (Signed)
Fetal Movement Counts Patient Name: ________________________________________________ Patient Due Date: ____________________ What is a fetal movement count?  A fetal movement count is the number of times that you feel your baby move during a certain amount of time. This may also be called a fetal kick count. A fetal movement count is recommended for every pregnant woman. You may be asked to start counting fetal movements as early as week 28 of your pregnancy. Pay attention to when your baby is most active. You may notice your baby's sleep and wake cycles. You may also notice things that make your baby move more. You should do a fetal movement count:  When your baby is normally most active.  At the same time each day. A good time to count movements is while you are resting, after having something to eat and drink. How do I count fetal movements? 1. Find a quiet, comfortable area. Sit, or lie down on your side. 2. Write down the date, the start time and stop time, and the number of movements that you felt between those two times. Take this information with you to your health care visits. 3. For 2 hours, count kicks, flutters, swishes, rolls, and jabs. You should feel at least 10 movements during 2 hours. 4. You may stop counting after you have felt 10 movements. 5. If you do not feel 10 movements in 2 hours, have something to eat and drink. Then, keep resting and counting for 1 hour. If you feel at least 4 movements during that hour, you may stop counting. Contact a health care provider if:  You feel fewer than 4 movements in 2 hours.  Your baby is not moving like he or she usually does. Date: ____________ Start time: ____________ Stop time: ____________ Movements: ____________ Date: ____________ Start time: ____________ Stop time: ____________ Movements: ____________ Date: ____________ Start time: ____________ Stop time: ____________ Movements: ____________ Date: ____________ Start time:  ____________ Stop time: ____________ Movements: ____________ Date: ____________ Start time: ____________ Stop time: ____________ Movements: ____________ Date: ____________ Start time: ____________ Stop time: ____________ Movements: ____________ Date: ____________ Start time: ____________ Stop time: ____________ Movements: ____________ Date: ____________ Start time: ____________ Stop time: ____________ Movements: ____________ Date: ____________ Start time: ____________ Stop time: ____________ Movements: ____________ This information is not intended to replace advice given to you by your health care provider. Make sure you discuss any questions you have with your health care provider. Document Released: 12/07/2006 Document Revised: 11/27/2018 Document Reviewed: 12/17/2015 Elsevier Patient Education  2020 [employer] Braxton Hicks Contractions Contractions of the uterus can occur throughout pregnancy, but they are not always a sign that you are in labor. You may have practice contractions called Braxton Hicks contractions. These false labor contractions are sometimes confused with true labor. What are Braxton Hicks contractions? Braxton Hicks contractions are tightening movements that occur in the muscles of the uterus before labor. Unlike true labor contractions, these contractions do not result in opening (dilation) and thinning of the cervix. Toward the end of pregnancy (32-34 weeks), Braxton Hicks contractions can happen more often and may become stronger. These contractions are sometimes difficult to tell apart from true labor because they can be very uncomfortable. You should not feel embarrassed if you go to the hospital with false labor. Sometimes, the only way to tell if you are in true labor is for your health care provider to look for changes in the cervix. The health care provider will do a physical exam and may monitor your contractions. If you   are not in true labor, the exam should show  that your cervix is not dilating and your water has not broken. If there are no other health problems associated with your pregnancy, it is completely safe for you to be sent home with false labor. You may continue to have Braxton Hicks contractions until you go into true labor. How to tell the difference between true labor and false labor True labor  Contractions last 30-70 seconds.  Contractions become very regular.  Discomfort is usually felt in the top of the uterus, and it spreads to the lower abdomen and low back.  Contractions do not go away with walking.  Contractions usually become more intense and increase in frequency.  The cervix dilates and gets thinner. False labor  Contractions are usually shorter and not as strong as true labor contractions.  Contractions are usually irregular.  Contractions are often felt in the front of the lower abdomen and in the groin.  Contractions may go away when you walk around or change positions while lying down.  Contractions get weaker and are shorter-lasting as time goes on.  The cervix usually does not dilate or become thin. Follow these instructions at home:   Take over-the-counter and prescription medicines only as told by your health care provider.  Keep up with your usual exercises and follow other instructions from your health care provider.  Eat and drink lightly if you think you are going into labor.  If Braxton Hicks contractions are making you uncomfortable: ? Change your position from lying down or resting to walking, or change from walking to resting. ? Sit and rest in a tub of warm water. ? Drink enough fluid to keep your urine pale yellow. Dehydration may cause these contractions. ? Do slow and deep breathing several times an hour.  Keep all follow-up prenatal visits as told by your health care provider. This is important. Contact a health care provider if:  You have a fever.  You have continuous pain in  your abdomen. Get help right away if:  Your contractions become stronger, more regular, and closer together.  You have fluid leaking or gushing from your vagina.  You pass blood-tinged mucus (bloody show).  You have bleeding from your vagina.  You have low back pain that you never had before.  You feel your baby's head pushing down and causing pelvic pressure.  Your baby is not moving inside you as much as it used to. Summary  Contractions that occur before labor are called Braxton Hicks contractions, false labor, or practice contractions.  Braxton Hicks contractions are usually shorter, weaker, farther apart, and less regular than true labor contractions. True labor contractions usually become progressively stronger and regular, and they become more frequent.  Manage discomfort from Braxton Hicks contractions by changing position, resting in a warm bath, drinking plenty of water, or practicing deep breathing. This information is not intended to replace advice given to you by your health care provider. Make sure you discuss any questions you have with your health care provider. Document Released: 03/23/2017 Document Revised: 10/20/2017 Document Reviewed: 03/23/2017 Elsevier Patient Education  2020 [employer] Inc[employer]  

## 2019-10-22 ENCOUNTER — Other Ambulatory Visit: Payer: Self-pay

## 2019-10-22 ENCOUNTER — Ambulatory Visit (INDEPENDENT_AMBULATORY_CARE_PROVIDER_SITE_OTHER): Payer: Medicaid Other | Admitting: Advanced Practice Midwife

## 2019-10-22 VITALS — BP 129/89 | HR 68 | Wt 164.5 lb

## 2019-10-22 DIAGNOSIS — Z3480 Encounter for supervision of other normal pregnancy, unspecified trimester: Secondary | ICD-10-CM

## 2019-10-22 DIAGNOSIS — O99820 Streptococcus B carrier state complicating pregnancy: Secondary | ICD-10-CM

## 2019-10-22 DIAGNOSIS — Z3A37 37 weeks gestation of pregnancy: Secondary | ICD-10-CM

## 2019-10-22 DIAGNOSIS — Z9151 Personal history of suicidal behavior: Secondary | ICD-10-CM | POA: Insufficient documentation

## 2019-10-22 DIAGNOSIS — Z915 Personal history of self-harm: Secondary | ICD-10-CM | POA: Insufficient documentation

## 2019-10-22 DIAGNOSIS — Z591 Inadequate housing: Secondary | ICD-10-CM

## 2019-10-22 NOTE — Patient Instructions (Signed)

## 2019-10-22 NOTE — Progress Notes (Signed)
   PRENATAL VISIT NOTE  Subjective:  Maria Bowman is a 33 y.o. [id number] at [redacted]w[redacted]d being seen today for ongoing prenatal care.  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  Patient reports fatigue and occasional contractions.  Contractions: Irregular. Vag. Bleeding: None.  Movement: Present. Denies leaking of fluid.   The following portions of the patient's history were reviewed and updated as appropriate: allergies, current medications, past family history, past medical history, past social history, past surgical history and problem list. Problem list updated.  Objective:   Vitals:   10/22/19 1519  BP: 129/89  Pulse: 68  Weight: 164 lb 8 oz (74.6 kg)    Fetal Status: Fetal Heart Rate (bpm): 122 Fundal Height: 37 cm Movement: Present  Presentation: Vertex  General:  Alert, oriented and cooperative. Patient is in no acute distress.  Skin: Skin is warm and dry. No rash noted.   Cardiovascular: Normal heart rate noted  Respiratory: Normal respiratory effort, no problems with respiration noted  Abdomen: Soft, gravid, appropriate for gestational age.  Pain/Pressure: Present     Pelvic: Cervical exam performed Dilation: Closed Effacement (%): Thick Station: Ballotable  Extremities: Normal range of motion.  Edema: None  Mental Status: Normal mood and affect. Normal behavior. Normal judgment and thought content.   Assessment and Plan:  Pregnancy: [id number] at [redacted]w[redacted]d  1. Supervision of other normal pregnancy, antepartum - Continue routine care - B-H ctx palpate mild. Prioritize rest, hydration, elevating feet if possible  2. Group B Streptococcus carrier state affecting pregnancy - PCN in  labor  3. Lives in sheltered housing - [place] for secured housing which should be available within 3 months of delivery  Term labor symptoms and general obstetric precautions including but not limited to vaginal bleeding, contractions, leaking of fluid and fetal movement were reviewed in detail with the patient. Please refer to After Visit Summary for other counseling recommendations.  Return in about 1 week (around 10/29/2019).  Future Appointments  Date Time Provider [place]  10/29/2019  3:15 PM Jorje Guild, NP [facility] [place]  11/05/2019  3:15 PM Ardean Larsen, Mervyn Skeeters, [place], CNM

## 2019-10-29 ENCOUNTER — Ambulatory Visit (INDEPENDENT_AMBULATORY_CARE_PROVIDER_SITE_OTHER): Payer: Medicaid Other | Admitting: Student

## 2019-10-29 ENCOUNTER — Other Ambulatory Visit: Payer: Self-pay

## 2019-10-29 VITALS — BP 122/79 | HR 87 | Temp 98.2°F | Wt 166.2 lb

## 2019-10-29 DIAGNOSIS — Z3480 Encounter for supervision of other normal pregnancy, unspecified trimester: Secondary | ICD-10-CM

## 2019-10-29 DIAGNOSIS — Z3483 Encounter for supervision of other normal pregnancy, third trimester: Secondary | ICD-10-CM

## 2019-10-29 DIAGNOSIS — Z3A38 38 weeks gestation of pregnancy: Secondary | ICD-10-CM

## 2019-10-29 NOTE — Progress Notes (Signed)
   PRENATAL VISIT NOTE  Subjective:  Maria Bowman is a 33 y.o. [id number] at [redacted]w[redacted]d being seen today for ongoing prenatal care.  She is currently monitored for the following issues for this low-risk pregnancy and has Supervision of other normal pregnancy, antepartum; Anti-D antibodies present during pregnancy; History of molar pregnancy; Anemia affecting pregnancy; Group B Streptococcus carrier state affecting pregnancy; and History of suicide attempt on their problem list.  Patient reports no complaints.  Contractions: Irregular. Vag. Bleeding: None.  Movement: Present. Denies leaking of fluid.   The following portions of the patient's history were reviewed and updated as appropriate: allergies, current medications, past family history, past medical history, past social history, past surgical history and problem list.   Objective:   Vitals:   10/29/19 1526  BP: 122/79  Pulse: 87  Temp: 98.2 F (36.8 C)  Weight: 166 lb 3.2 oz (75.4 kg)    Fetal Status: Fetal Heart Rate (bpm): 132 Fundal Height: 39 cm Movement: Present     General:  Alert, oriented and cooperative. Patient is in no acute distress.  Skin: Skin is warm and dry. No rash noted.   Cardiovascular: Normal heart rate noted  Respiratory: Normal respiratory effort, no problems with respiration noted  Abdomen: Soft, gravid, appropriate for gestational age.  Pain/Pressure: Present     Pelvic: Cervical exam deferred        Extremities: Normal range of motion.  Edema: None  Mental Status: Normal mood and affect. Normal behavior. Normal judgment and thought content.   Assessment and Plan:  Pregnancy: [id number] at [redacted]w[redacted]d 1. Supervision of other normal pregnancy, antepartum -doing well. Declines cervical check today. Scheduled for f/u next week. Will schedule for 41 wk IOL at that appointment if she's hasn't had baby by then.   Term labor symptoms and general obstetric precautions including but not limited to vaginal bleeding,  contractions, leaking of fluid and fetal movement were reviewed in detail with the patient. Please refer to After Visit Summary for other counseling recommendations.   Return in about 1 week (around 11/05/2019) for Routine OB, in person.  Future Appointments  Date Time Provider [place]  11/05/2019  3:15 PM Starr Lake, CNM [facility]    Jorje Guild, NP

## 2019-10-29 NOTE — Patient Instructions (Signed)
Fetal Movement Counts Patient Name: ________________________________________________ Patient Due Date: ____________________ What is a fetal movement count?  A fetal movement count is the number of times that you feel your baby move during a certain amount of time. This may also be called a fetal kick count. A fetal movement count is recommended for every pregnant woman. You may be asked to start counting fetal movements as early as week 28 of your pregnancy. Pay attention to when your baby is most active. You may notice your baby's sleep and wake cycles. You may also notice things that make your baby move more. You should do a fetal movement count:  When your baby is normally most active.  At the same time each day. A good time to count movements is while you are resting, after having something to eat and drink. How do I count fetal movements? 1. Find a quiet, comfortable area. Sit, or lie down on your side. 2. Write down the date, the start time and stop time, and the number of movements that you felt between those two times. Take this information with you to your health care visits. 3. For 2 hours, count kicks, flutters, swishes, rolls, and jabs. You should feel at least 10 movements during 2 hours. 4. You may stop counting after you have felt 10 movements. 5. If you do not feel 10 movements in 2 hours, have something to eat and drink. Then, keep resting and counting for 1 hour. If you feel at least 4 movements during that hour, you may stop counting. Contact a health care provider if:  You feel fewer than 4 movements in 2 hours.  Your baby is not moving like he or she usually does. Date: ____________ Start time: ____________ Stop time: ____________ Movements: ____________ Date: ____________ Start time: ____________ Stop time: ____________ Movements: ____________ Date: ____________ Start time: ____________ Stop time: ____________ Movements: ____________ Date: ____________ Start time:  ____________ Stop time: ____________ Movements: ____________ Date: ____________ Start time: ____________ Stop time: ____________ Movements: ____________ Date: ____________ Start time: ____________ Stop time: ____________ Movements: ____________ Date: ____________ Start time: ____________ Stop time: ____________ Movements: ____________ Date: ____________ Start time: ____________ Stop time: ____________ Movements: ____________ Date: ____________ Start time: ____________ Stop time: ____________ Movements: ____________ This information is not intended to replace advice given to you by your health care provider. Make sure you discuss any questions you have with your health care provider. Document Released: 12/07/2006 Document Revised: 11/27/2018 Document Reviewed: 12/17/2015 Elsevier Patient Education  2020 Else[employer] Inc. Signs and Symptoms of Labor Labor is your body's natural process of moving your baby, placenta, and umbilical cord out of your uterus. The process of labor usually starts when your baby is full-term, between 37 and 40 weeks of pregnancy. How will I know when I am close to going into labor? As your body prepares for labor and the birth of your baby, you may notice the following symptoms in the weeks and days before true labor starts:  Having a strong desire to get your home ready to receive your new baby. This is called nesting. Nesting may be a sign that labor is approaching, and it may occur several weeks before birth. Nesting may involve cleaning and organizing your home.  Passing a small amount of thick, bloody mucus out of your vagina (normal bloody show or losing your mucus plug). This may happen more than a week before labor begins, or it might occur right before labor begins as the opening of the cervix starts   to widen (dilate). For some women, the entire mucus plug passes at once. For others, smaller portions of the mucus plug may gradually pass over several days.  Your baby  moving (dropping) lower in your pelvis to get into position for birth (lightening). When this happens, you may feel more pressure on your bladder and pelvic bone and less pressure on your ribs. This may make it easier to breathe. It may also cause you to need to urinate more often and have problems with bowel movements.  Having "practice contractions" (Braxton Hicks contractions) that occur at irregular (unevenly spaced) intervals that are more than 10 minutes apart. This is also called false labor. False labor contractions are common after exercise or sexual activity, and they will stop if you change position, rest, or drink fluids. These contractions are usually mild and do not get stronger over time. They may feel like: ? A backache or back pain. ? Mild cramps, similar to menstrual cramps. ? Tightening or pressure in your abdomen. Other early symptoms that labor may be starting soon include:  Nausea or loss of appetite.  Diarrhea.  Having a sudden burst of energy, or feeling very tired.  Mood changes.  Having trouble sleeping. How will I know when labor has begun? Signs that true labor has begun may include:  Having contractions that come at regular (evenly spaced) intervals and increase in intensity. This may feel like more intense tightening or pressure in your abdomen that moves to your back. ? Contractions may also feel like rhythmic pain in your upper thighs or back that comes and goes at regular intervals. ? For first-time mothers, this change in intensity of contractions often occurs at a more gradual pace. ? Women who have given birth before may notice a more rapid progression of contraction changes.  Having a feeling of pressure in the vaginal area.  Your water breaking (rupture of membranes). This is when the sac of fluid that surrounds your baby breaks. When this happens, you will notice fluid leaking from your vagina. This may be clear or blood-tinged. Labor usually starts  within 24 hours of your water breaking, but it may take longer to begin. ? Some women notice this as a gush of fluid. ? Others notice that their underwear repeatedly becomes damp. Follow these instructions at home:   When labor starts, or if your water breaks, call your health care provider or nurse care line. Based on your situation, they will determine when you should go in for an exam.  When you are in early labor, you may be able to rest and manage symptoms at home. Some strategies to try at home include: ? Breathing and relaxation techniques. ? Taking a warm bath or shower. ? Listening to music. ? Using a heating pad on the lower back for pain. If you are directed to use heat:  Place a towel between your skin and the heat source.  Leave the heat on for 20-30 minutes.  Remove the heat if your skin turns bright red. This is especially important if you are unable to feel pain, heat, or cold. You may have a greater risk of getting burned. Get help right away if:  You have painful, regular contractions that are 5 minutes apart or less.  Labor starts before you are [redacted] weeks along in your pregnancy.  You have a fever.  You have a headache that does not go away.  You have bright red blood coming from your vagina.  You   do not feel your baby moving.  You have a sudden onset of: ? Severe headache with vision problems. ? Nausea, vomiting, or diarrhea. ? Chest pain or shortness of breath. These symptoms may be an emergency. If your health care provider recommends that you go to the hospital or birth center where you plan to deliver, do not drive yourself. Have someone else drive you, or call emergency services (911 in the [place] Summary  Labor is your body's natural process of moving your baby, placenta, and umbilical cord out of your uterus.  The process of labor usually starts when your baby is full-term, between 37 and 40 weeks of pregnancy.  When labor starts, or if your water  breaks, call your health care provider or nurse care line. Based on your situation, they will determine when you should go in for an exam. This information is not intended to replace advice given to you by your health care provider. Make sure you discuss any questions you have with your health care provider. Document Released: 04/14/2017 Document Revised: 08/07/2017 Document Reviewed: 04/14/2017 Elsevier Patient Education  2020 Elsevier Inc.  

## 2019-11-05 ENCOUNTER — Ambulatory Visit (INDEPENDENT_AMBULATORY_CARE_PROVIDER_SITE_OTHER): Payer: Medicaid Other | Admitting: Student

## 2019-11-05 ENCOUNTER — Other Ambulatory Visit: Payer: Self-pay

## 2019-11-05 DIAGNOSIS — Z3A39 39 weeks gestation of pregnancy: Secondary | ICD-10-CM

## 2019-11-05 DIAGNOSIS — Z3480 Encounter for supervision of other normal pregnancy, unspecified trimester: Secondary | ICD-10-CM

## 2019-11-05 DIAGNOSIS — Z3483 Encounter for supervision of other normal pregnancy, third trimester: Secondary | ICD-10-CM

## 2019-11-05 NOTE — Progress Notes (Signed)
   PRENATAL VISIT NOTE  Subjective:  Maria Bowman is a 33 y.o. [id number] at [redacted]w[redacted]d being seen today for ongoing prenatal care.  She is currently monitored for the following issues for this low-risk pregnancy and has Supervision of other normal pregnancy, antepartum; Anti-D antibodies present during pregnancy; History of molar pregnancy; Anemia affecting pregnancy; Group B Streptococcus carrier state affecting pregnancy; and History of suicide attempt on their problem list.  Patient reports no complaints.  Contractions: Irregular. Vag. Bleeding: None.  Movement: Present. Denies leaking of fluid.   The following portions of the patient's history were reviewed and updated as appropriate: allergies, current medications, past family history, past medical history, past social history, past surgical history and problem list.   Objective:   Vitals:   11/05/19 1536  BP: 124/79  Pulse: 67  Weight: 165 lb 6.4 oz (75 kg)    Fetal Status: Fetal Heart Rate (bpm): 133 Fundal Height: 40 cm Movement: Present     General:  Alert, oriented and cooperative. Patient is in no acute distress.  Skin: Skin is warm and dry. No rash noted.   Cardiovascular: Normal heart rate noted  Respiratory: Normal respiratory effort, no problems with respiration noted  Abdomen: Soft, gravid, appropriate for gestational age.  Pain/Pressure: Present     Pelvic: Cervical exam deferred        Extremities: Normal range of motion.  Edema: None  Mental Status: Normal mood and affect. Normal behavior. Normal judgment and thought content.   Assessment and Plan:  Pregnancy: [id number] at [redacted]w[redacted]d 1. Supervision of other normal pregnancy, antepartum -IOL orders placed  -Patient does not want FB placed.  -Reviewed signs and symptoms of labor and fetal movements and when to go to the hospital.   Term labor symptoms and general obstetric precautions including but not limited to vaginal bleeding, contractions, leaking of fluid and fetal  movement were reviewed in detail with the patient. Please refer to After Visit Summary for other counseling recommendations.   Return in about 1 week (around 11/12/2019), or end of this week or early next week for NST/BPP.  Future Appointments  Date Time Provider Uncertain  11/11/2019  8:15 AM [facility] [place] [facility]  11/13/2019  7:00 AM [facility] [place] None    Starr Lake, CNM

## 2019-11-05 NOTE — Patient Instructions (Signed)
New Induction of Labor Process for [place] [facility] [place]  In Fall 2020 [facility] changed its process for scheduling inductions of labor to create more induction slots and to make sure patients get any pre-procedure testing that they need in advance.   You have been scheduled for induction of labor on 12/23 at 7:00 am.  Please arrive on  at 7am at Entrance C, Maternity Assessment Unit (MAU) security desk unless you are called to be placed on hold. You may eat a light meal before coming to the hospital.  You have been scheduled for induction of labor on 12/23 (or a few days earlier or later, your appt will be in My Chart.  .  Although you may have a specific time listed on your After Visit Summary or MyChart, we cannot predict when your room will be available. Please disregard this time. A Labor and Delivery staff member will call you on the day that you are scheduled when your room is available. You will need to arrive within one hour of being called. If you do not arrive within this time frame, the next person on the list will be called in and you will move down the list. You may eat a light meal before coming to the hospital. If you go into labor, think your water has broken, experience bright red bleeding or don't feel your baby moving as much as usual before your induction, please call your Ob/Gyn's office or come to Entrance C, Maternity Assessment Unit for evaluation.  Thank you,  [facility] [place] Induction  Labor induction is when steps are taken to cause a pregnant woman to begin the labor process. Most women go into labor on their own between 37 weeks and 42 weeks of pregnancy. When this does not happen or when there is a medical need for labor to begin, steps may be taken to induce labor. Labor induction causes a pregnant woman's uterus to contract. It also causes the cervix to soften (ripen), open (dilate), and  thin out (efface). Usually, labor is not induced before 39 weeks of pregnancy unless there is a medical reason to do so. Your health care provider will determine if labor induction is needed. Before inducing labor, your health care provider will consider a number of factors, including:  Your medical condition and your baby's.  How many weeks along you are in your pregnancy.  How mature your baby's lungs are.  The condition of your cervix.  The position of your baby.  The size of your birth canal. What are some reasons for labor induction? Labor may be induced if:  Your health or your baby's health is at risk.  Your pregnancy is overdue by 1 week or more.  Your water breaks but labor does not start on its own.  There is a low amount of amniotic fluid around your baby. You may also choose (elect) to have labor induced at a certain time. Generally, elective labor induction is done no earlier than 39 weeks of pregnancy. What methods are used for labor induction? Methods used for labor induction include:  Prostaglandin medicine. This medicine starts contractions and causes the cervix to dilate and ripen. It can be taken by mouth (orally) or by being inserted into the vagina (suppository).  Inserting a small, thin tube (catheter) with a balloon into the vagina and then expanding the balloon with water to dilate the cervix.  Stripping  the membranes. In this method, your health care provider gently separates amniotic sac tissue from the cervix. This causes the cervix to stretch, which in turn causes the release of a hormone called progesterone. The hormone causes the uterus to contract. This procedure is often done during an office visit, after which you will be sent home to wait for contractions to begin.  Breaking the water. In this method, your health care provider uses a small instrument to make a small hole in the amniotic sac. This eventually causes the amniotic sac to break.  Contractions should begin after a few hours.  Medicine to trigger or strengthen contractions. This medicine is given through an IV that is inserted into a vein in your arm. Except for membrane stripping, which can be done in a clinic, labor induction is done in the hospital so that you and your baby can be carefully monitored. How long does it take for labor to be induced? The length of time it takes to induce labor depends on how ready your body is for labor. Some inductions can take up to 2-3 days, while others may take less than a day. Induction may take longer if:  You are induced early in your pregnancy.  It is your first pregnancy.  Your cervix is not ready. What are some risks associated with labor induction? Some risks associated with labor induction include:  Changes in fetal heart rate, such as being too high, too low, or irregular (erratic).  Failed induction.  Infection in the mother or the baby.  Increased risk of having a cesarean delivery.  Fetal death.  Breaking off (abruption) of the placenta from the uterus (rare).  Rupture of the uterus (very rare). When induction is needed for medical reasons, the benefits of induction generally outweigh the risks. What are some reasons for not inducing labor? Labor induction should not be done if:  Your baby does not tolerate contractions.  You have had previous surgeries on your uterus, such as a myomectomy, removal of fibroids, or a vertical scar from a previous cesarean delivery.  Your placenta lies very low in your uterus and blocks the opening of the cervix (placenta previa).  Your baby is not in a head-down position.  The umbilical cord drops down into the birth canal in front of the baby.  There are unusual circumstances, such as the baby being very early (premature).  You have had more than 2 previous cesarean deliveries. Summary  Labor induction is when steps are taken to cause a pregnant woman to begin the  labor process.  Labor induction causes a pregnant woman's uterus to contract. It also causes the cervix to ripen, dilate, and efface.  Labor is not induced before 39 weeks of pregnancy unless there is a medical reason to do so.  When induction is needed for medical reasons, the benefits of induction generally outweigh the risks. This information is not intended to replace advice given to you by your health care provider. Make sure you discuss any questions you have with your health care provider. Document Released: 03/29/2007 Document Revised: 11/10/2017 Document Reviewed: 12/21/2016 Elsevier Patient Education  2020 [employer].

## 2019-11-05 NOTE — Progress Notes (Signed)
IOL labor scheduled for 11/13/19; daytime appt. Pt notified via [place].   Apolonio Schneiders RN 11/05/19

## 2019-11-06 ENCOUNTER — Inpatient Hospital Stay (HOSPITAL_COMMUNITY): Admission: RE | Admit: 2019-11-06 | Payer: Medicaid Other | Source: Home / Self Care

## 2019-11-06 ENCOUNTER — Encounter: Payer: Self-pay | Admitting: General Practice

## 2019-11-06 ENCOUNTER — Telehealth (HOSPITAL_COMMUNITY): Payer: Self-pay | Admitting: *Deleted

## 2019-11-06 ENCOUNTER — Encounter (HOSPITAL_COMMUNITY): Payer: Self-pay | Admitting: *Deleted

## 2019-11-06 ENCOUNTER — Other Ambulatory Visit: Payer: Self-pay | Admitting: Advanced Practice Midwife

## 2019-11-06 NOTE — Telephone Encounter (Signed)
Preadmission screen  

## 2019-11-09 ENCOUNTER — Inpatient Hospital Stay (HOSPITAL_COMMUNITY): Payer: Medicaid Other | Admitting: Anesthesiology

## 2019-11-09 ENCOUNTER — Inpatient Hospital Stay (HOSPITAL_COMMUNITY)
Admission: AD | Admit: 2019-11-09 | Discharge: 2019-11-11 | DRG: 807 | Disposition: A | Discharge status: Home / Self Care | Payer: Medicaid Other | Attending: Obstetrics & Gynecology | Admitting: Obstetrics & Gynecology

## 2019-11-09 ENCOUNTER — Other Ambulatory Visit: Payer: Self-pay

## 2019-11-09 DIAGNOSIS — O9902 Anemia complicating childbirth: Secondary | ICD-10-CM | POA: Diagnosis present

## 2019-11-09 DIAGNOSIS — Z3A40 40 weeks gestation of pregnancy: Secondary | ICD-10-CM

## 2019-11-09 DIAGNOSIS — Z6791 Unspecified blood type, Rh negative: Secondary | ICD-10-CM | POA: Diagnosis not present

## 2019-11-09 DIAGNOSIS — O26893 Other specified pregnancy related conditions, third trimester: Secondary | ICD-10-CM | POA: Diagnosis present

## 2019-11-09 DIAGNOSIS — O480 Post-term pregnancy: Secondary | ICD-10-CM

## 2019-11-09 DIAGNOSIS — O26899 Other specified pregnancy related conditions, unspecified trimester: Secondary | ICD-10-CM

## 2019-11-09 DIAGNOSIS — D649 Anemia, unspecified: Secondary | ICD-10-CM | POA: Diagnosis present

## 2019-11-09 DIAGNOSIS — O99824 Streptococcus B carrier state complicating childbirth: Secondary | ICD-10-CM | POA: Diagnosis present

## 2019-11-09 DIAGNOSIS — Z20828 Contact with and (suspected) exposure to other viral communicable diseases: Secondary | ICD-10-CM | POA: Diagnosis present

## 2019-11-09 DIAGNOSIS — O4292 Full-term premature rupture of membranes, unspecified as to length of time between rupture and onset of labor: Secondary | ICD-10-CM | POA: Diagnosis present

## 2019-11-09 DIAGNOSIS — O99019 Anemia complicating pregnancy, unspecified trimester: Secondary | ICD-10-CM | POA: Diagnosis present

## 2019-11-09 LAB — CBC
HCT: 38.1 % (ref 36.0–46.0)
Hemoglobin: 13.2 g/dL (ref 12.0–15.0)
MCH: 29.1 pg (ref 26.0–34.0)
MCHC: 34.6 g/dL (ref 30.0–36.0)
MCV: 84.1 fL (ref 80.0–100.0)
Platelets: 193 10*3/uL (ref 150–400)
RBC: 4.53 MIL/uL (ref 3.87–5.11)
RDW: 13.7 % (ref 11.5–15.5)
WBC: 8.7 10*3/uL (ref 4.0–10.5)
nRBC: 0 % (ref 0.0–0.2)

## 2019-11-09 MED ORDER — ONDANSETRON HCL 4 MG/2ML IJ SOLN: 4.0000 mg | Freq: Four times a day (QID) | INTRAMUSCULAR | Status: DC | PRN

## 2019-11-09 MED ORDER — PREPLUS 27-1 MG PO TABS: 1.0000 | ORAL_TABLET | Freq: Every day | ORAL | Status: DC

## 2019-11-09 MED ORDER — TRANEXAMIC ACID-NACL 1000-0.7 MG/100ML-% IV SOLN: 1000.0000 mg | Freq: Once | INTRAVENOUS | Status: DC

## 2019-11-09 MED ORDER — SOD CITRATE-CITRIC ACID 500-334 MG/5ML PO SOLN: 30.0000 mL | ORAL | Status: DC | PRN

## 2019-11-09 MED ORDER — PENICILLIN G POT IN DEXTROSE 60000 UNIT/ML IV SOLN: 3.0000 10*6.[IU] | INTRAVENOUS | Status: DC

## 2019-11-09 MED ORDER — MISOPROSTOL 200 MCG PO TABS
400.0000 ug | ORAL_TABLET | Freq: Once | ORAL | Status: AC
  Administered 2019-11-09: 400 ug via RECTAL

## 2019-11-09 MED ORDER — LACTATED RINGERS IV SOLN: 500.0000 mL | Freq: Once | INTRAVENOUS | Status: DC

## 2019-11-09 MED ORDER — MISOPROSTOL 200 MCG PO TABS
ORAL_TABLET | ORAL | Status: AC
  Administered 2019-11-09: 400 ug
  Filled 2019-11-09: qty 4

## 2019-11-09 MED ORDER — LIDOCAINE HCL (PF) 1 % IJ SOLN: 30.0000 mL | INTRAMUSCULAR | Status: DC | PRN

## 2019-11-09 MED ORDER — PRENATAL PLUS 27-1 MG PO TABS: ORAL_TABLET | Freq: Every morning | ORAL | Status: DC

## 2019-11-09 MED ORDER — PHENYLEPHRINE 40 MCG/ML (10ML) SYRINGE FOR IV PUSH (FOR BLOOD PRESSURE SUPPORT)
80.0000 ug | PREFILLED_SYRINGE | INTRAVENOUS | Status: DC | PRN
  Filled 2019-11-09: qty 10

## 2019-11-09 MED ORDER — LACTATED RINGERS IV SOLN: 500.0000 mL | INTRAVENOUS | Status: DC | PRN

## 2019-11-09 MED ORDER — MISOPROSTOL 200 MCG PO TABS
400.0000 ug | ORAL_TABLET | Freq: Once | ORAL | Status: AC
  Administered 2019-11-09: 400 ug via BUCCAL

## 2019-11-09 MED ORDER — OXYCODONE-ACETAMINOPHEN 5-325 MG PO TABS: 1.0000 | ORAL_TABLET | ORAL | Status: DC | PRN

## 2019-11-09 MED ORDER — LACTATED RINGERS IV SOLN: INTRAVENOUS | Status: DC

## 2019-11-09 MED ORDER — OXYTOCIN BOLUS FROM INFUSION
500.0000 mL | Freq: Once | INTRAVENOUS | Status: AC
  Administered 2019-11-09: 500 mL via INTRAVENOUS

## 2019-11-09 MED ORDER — LIDOCAINE-EPINEPHRINE (PF) 2 %-1:200000 IJ SOLN
INTRAMUSCULAR | Status: DC | PRN
  Administered 2019-11-09 (×2): 2 mL via EPIDURAL

## 2019-11-09 MED ORDER — EPHEDRINE 5 MG/ML INJ: 10.0000 mg | INTRAVENOUS | Status: DC | PRN

## 2019-11-09 MED ORDER — TRANEXAMIC ACID-NACL 1000-0.7 MG/100ML-% IV SOLN
INTRAVENOUS | Status: AC
  Administered 2019-11-09: 1000 mg
  Filled 2019-11-09: qty 100

## 2019-11-09 MED ORDER — FENTANYL CITRATE (PF) 100 MCG/2ML IJ SOLN
50.0000 ug | INTRAMUSCULAR | Status: DC | PRN
  Administered 2019-11-09: 100 ug via INTRAVENOUS
  Filled 2019-11-09 (×2): qty 2

## 2019-11-09 MED ORDER — PHENYLEPHRINE 40 MCG/ML (10ML) SYRINGE FOR IV PUSH (FOR BLOOD PRESSURE SUPPORT): 80.0000 ug | PREFILLED_SYRINGE | INTRAVENOUS | Status: DC | PRN

## 2019-11-09 MED ORDER — FENTANYL-BUPIVACAINE-NACL 0.5-0.125-0.9 MG/250ML-% EP SOLN
12.0000 mL/h | EPIDURAL | Status: DC | PRN
  Filled 2019-11-09: qty 250

## 2019-11-09 MED ORDER — ACETAMINOPHEN 325 MG PO TABS: 650.0000 mg | ORAL_TABLET | ORAL | Status: DC | PRN

## 2019-11-09 MED ORDER — OXYTOCIN 40 UNITS IN NORMAL SALINE INFUSION - SIMPLE MED
2.5000 [IU]/h | INTRAVENOUS | Status: DC
  Filled 2019-11-09: qty 1000

## 2019-11-09 MED ORDER — SODIUM CHLORIDE 0.9 % IV SOLN
2.0000 g | Freq: Once | INTRAVENOUS | Status: AC
  Administered 2019-11-09: 2 g via INTRAVENOUS
  Filled 2019-11-09: qty 2000

## 2019-11-09 MED ORDER — SODIUM CHLORIDE 0.9 % IV SOLN: 5.0000 10*6.[IU] | Freq: Once | INTRAVENOUS | Status: DC

## 2019-11-09 MED ORDER — POLYSACCHARIDE IRON COMPLEX 150 MG PO CAPS: 150.0000 mg | ORAL_CAPSULE | ORAL | Status: DC

## 2019-11-09 MED ORDER — SODIUM CHLORIDE (PF) 0.9 % IJ SOLN
INTRAMUSCULAR | Status: DC | PRN
  Administered 2019-11-09: 12 mL/h via EPIDURAL

## 2019-11-09 MED ORDER — FLEET ENEMA 7-19 GM/118ML RE ENEM: 1.0000 | ENEMA | RECTAL | Status: DC | PRN

## 2019-11-09 MED ORDER — DIPHENHYDRAMINE HCL 50 MG/ML IJ SOLN: 12.5000 mg | INTRAMUSCULAR | Status: DC | PRN

## 2019-11-09 MED ORDER — OXYCODONE-ACETAMINOPHEN 5-325 MG PO TABS: 2.0000 | ORAL_TABLET | ORAL | Status: DC | PRN

## 2019-11-09 NOTE — MAU Note (Signed)
Pt presents via EMS with contractions and SROM.

## 2019-11-09 NOTE — Anesthesia Procedure Notes (Signed)
Epidural Patient location during procedure: OB Start time: 11/09/2019 9:20 PM End time: 11/09/2019 9:35 PM  Staffing Anesthesiologist: Freddrick March, MD Performed: anesthesiologist   Preanesthetic Checklist Completed: patient identified, IV checked, risks and benefits discussed, monitors and equipment checked, pre-op evaluation and timeout performed  Epidural Patient position: sitting Prep: DuraPrep and site prepped and draped Patient monitoring: continuous pulse ox, blood pressure, heart rate and cardiac monitor Approach: midline Location: L3-L4 Injection technique: LOR air  Needle:  Needle type: Tuohy  Needle gauge: 17 G Needle length: 9 cm Needle insertion depth: 5 cm Catheter type: closed end flexible Catheter size: 19 Gauge Catheter at skin depth: 11 cm Test dose: negative  Assessment Sensory level: T8 Events: blood not aspirated, injection not painful, no injection resistance, no paresthesia and negative IV test  Additional Notes Patient identified. Risks/Benefits/Options discussed with patient including but not limited to bleeding, infection, nerve damage, paralysis, failed block, incomplete pain control, headache, blood pressure changes, nausea, vomiting, reactions to medication both or allergic, itching and postpartum back pain. Confirmed with bedside nurse the patient's most recent platelet count. Confirmed with patient that they are not currently taking any anticoagulation, have any bleeding history or any family history of bleeding disorders. Patient expressed understanding and wished to proceed. All questions were answered. Sterile technique was used throughout the entire procedure. Please see nursing notes for vital signs. Test dose was given through epidural catheter and negative prior to continuing to dose epidural or start infusion. Warning signs of high block given to the patient including shortness of breath, tingling/numbness in hands, complete motor block,  or any concerning symptoms with instructions to call for help. Patient was given instructions on fall risk and not to get out of bed. All questions and concerns addressed with instructions to call with any issues or inadequate analgesia.  Reason for block:procedure for pain

## 2019-11-09 NOTE — MAU Provider Note (Signed)
  S: Ms. ALONNI HEIMSOTH is a 33 y.o. [id number] at [redacted]w[redacted]d  who presents to MAU via EMS today complaining of SOL and leaking of fluid since about 15 minutes before her arrival to MAU. She denies vaginal bleeding. She endorses contractions. She reports normal fetal movement.    O: BP 119/78 (BP Location: Left Arm)   Pulse 84   Temp 98.1 F (36.7 C) (Oral)   Resp 20   LMP 02/03/2019  GENERAL: Well-developed, well-nourished female in no acute distress.  HEAD: Normocephalic, atraumatic.  CHEST: Normal effort of breathing, regular heart rate ABDOMEN: Soft, nontender, gravid PELVIC: Normal external female genitalia. Vagina is pink and rugated. Cervix with normal contour, no lesions. Normal discharge.   Cervical exam: 2.5-3/60%/-3     Fetal Monitoring: Baseline: 115 Variability: Mod Accelerations: 10 x 10 Decelerations: Early and Late decels Contractions: q 3-4 min  No results found for this or any previous visit (from the past 24 hour(s)).  A: SIUP at [redacted]w[redacted]d  GBS + Grossly ruptured at about 8pm today Vertex by suture  P: Admit to L&D Report called to labor team by MAU Charge RN  Mallie Snooks, [place] 11/09/2019 8:39 PM

## 2019-11-09 NOTE — MAU Note (Signed)
Pt had COVID test done today at urgent care in [place]. Results were negative. Copy of results requested. Pt had family member send photo copy of results to her phone. I saw photo on the patients phone. AC notified and okay with today's results. LD charge nurse made aware.

## 2019-11-09 NOTE — H&P (Addendum)
OBSTETRIC ADMISSION HISTORY AND PHYSICAL  Maria Bowman is a 33 y.o. female [id number] with IUP at [redacted]w[redacted]d by 12 wk [place] presenting for SOL after SROM at 1900 with clear fluid. She has been having contractions all day which increased in intensity after LOF. She reports +FM, no VB, no blurry vision, headaches or peripheral edema, and RUQ pain.  She plans on breast and bottle feeding. She request nothing for birth control as she is abstinent. She received her [facility]  Dating: By LMP confimred by 12 wk [place] --->  Estimated Date of Delivery: 11/06/19  Sono:    @[redacted]w[redacted]d , CWD, normal anatomy, cephalic presentation, anterior placental lie, 520g, 38% EFW   Prenatal History/Complications: Rh negative, antibody-D positive PP hemorrhage requiring transfusion w/ second baby History of Suicide attempt 2012  Past Medical History: Past Medical History:  Diagnosis Date  . Anemia   . Fractured tooth    upper front tooth fractured  . Hemorrhage after delivery of fetus 2003  . SVD (spontaneous vaginal delivery)    x 2    Past Surgical History: Past Surgical History:  Procedure Laterality Date  . DILATION AND EVACUATION N/A 03/27/2018   Procedure: DILATATION AND EVACUATION;  Surgeon: Osborne Oman, MD;  Location: [facility];  Service: Gynecology;  Laterality: N/A;  Ultrasound guided  . INDUCED ABORTION     has had 6 elective abortions  . THERAPEUTIC ABORTION      Obstetrical History: OB History    Gravida  10   Para  2   Term  2   Preterm  0   AB  7   Living  2     SAB  0   TAB  6   Ectopic  0   Multiple  0   Live Births  2           Social History: Social History   Socioeconomic History  . Marital status: Single    Spouse name: Not on file  . Number of children: Not on file  . Years of education: Not on file  . Highest education level: Not on file  Occupational History  . Not on file  Tobacco Use  . Smoking status: Never Smoker  . Smokeless tobacco:  Never Used  Substance and Sexual Activity  . Alcohol use: Not Currently    Comment: socially  . Drug use: Not Currently    Types: Marijuana    Comment: Last use June 2020  . Sexual activity: Yes    Birth control/protection: None  Other Topics Concern  . Not on file  Social History Narrative  . Not on file   Social Determinants of Health   Financial Resource Strain:   . Difficulty of Paying Living Expenses: Not on file  Food Insecurity: No Food Insecurity  . Worried About Charity fundraiser in the Last Year: Never true  . Ran Out of Food in the Last Year: Never true  Transportation Needs: No Transportation Needs  . Lack of Transportation (Medical): No  . Lack of Transportation (Non-Medical): No  Physical Activity:   . Days of Exercise per Week: Not on file  . Minutes of Exercise per Session: Not on file  Stress:   . Feeling of Stress : Not on file  Social Connections:   . Frequency of Communication with Friends and Family: Not on file  . Frequency of Social Gatherings with Friends and Family: Not on file  . Attends Religious  Services: Not on file  . Active Member of Clubs or Organizations: Not on file  . Attends Banker Meetings: Not on file  . Marital Status: Not on file    Family History: Family History  Problem Relation Age of Onset  . Stroke Mother   . Heart disease Maternal Grandfather     Allergies: No Known Allergies  Medications Prior to Admission  Medication Sig Dispense Refill Last Dose  . iron polysaccharides (NIFEREX) 150 MG capsule Take 1 capsule (150 mg total) by mouth every other day. 30 capsule 6   . Prenatal Vit-Fe Fumarate-FA (PREPLUS) 27-1 MG TABS Take 1 tablet by mouth daily. 30 tablet 13      Review of Systems   All systems reviewed and negative except as stated in HPI  Blood pressure 119/78, pulse 84, temperature 98.1 F (36.7 C), temperature source Oral, resp. rate 20, last menstrual period 02/03/2019, unknown if  currently breastfeeding. General appearance: alert, cooperative, appears stated age and moderate distress Lungs: clear to auscultation bilaterally Heart: regular rate and rhythm Abdomen: soft, non-tender; bowel sounds normal Pelvic: 8/100/+2 Extremities: Homans sign is negative, no sign of DVT Presentation: cephalic Fetal monitoringBaseline: 110 bpm, Variability: Good {> 6 bpm), Accelerations: Reactive and Decelerations: Early Uterine activityFrequency: Every 3 minutes     Prenatal labs: ABO, Rh: O/Negative/-- (06/29 1008) Antibody: Positive, See Final Results (09/21 1041) Rubella: 1.41 (06/29 1008) RPR: Non Reactive (09/21 1041)  HBsAg: Negative (06/29 1008)  HIV: Non Reactive (09/21 1041)  GBS: Positive/-- (11/16 1228)  1 hr Glucola normal Genetic screening  declined Anatomy US normal  Prenatal Transfer Tool  Maternal Diabetes: No Genetic Screening: Declined Maternal Ultrasounds/Referrals: Normal Fetal Ultrasounds or other Referrals:  None Maternal Substance Abuse:  No Significant Maternal Medications:  None Significant Maternal Lab Results: Group B Strep positive and Rh negative  No results found for this or any previous visit (from the past 24 hour(s)).  Patient Active Problem List   Diagnosis Date Noted  . History of suicide attempt 10/22/2019  . Group B Streptococcus carrier state affecting pregnancy 10/10/2019  . History of molar pregnancy 05/20/2019  . Anemia affecting pregnancy 05/20/2019  . Supervision of other normal pregnancy, antepartum May 26, 2019  . Anti-D antibodies present during pregnancy 26-May-2019    Assessment/Plan:  Maria Bowman is a 33 y.o. [id number] at [redacted]w[redacted]d here for SOL after SROM at 1900.  #Labor: Progressing quickly on her own. Anticipate vaginal delivery. Given history of PPH w/ second child, will administer TXA prior to delivery. #Pain: epidural #FWB: Cat 1 FHT #ID:  GBS positive- Will dose ampicillin stat as her labor is progressing  quickly. COVID negative at urgent care yesterday. #MOF: breast and bottle #MOC:abstinence, reviewed options for inpatient LARC.  Denzil Hughes, MD  11/09/2019, 8:40 PM  I confirm that I have verified the information documented in the resident's note and that I have also personally reperformed the history, physical exam and all medical decision making activities of this service and have verified that all service and findings are accurately documented in this student's note.   Sharyon Cable, CNM 11/10/2019 1:45 AM

## 2019-11-09 NOTE — Anesthesia Preprocedure Evaluation (Signed)
Anesthesia Evaluation  Patient identified by MRN, date of birth, ID band Patient awake    Reviewed: Allergy & Precautions, NPO status , Patient's Chart, lab work & pertinent test results  Airway Mallampati: II  TM Distance: >3 FB Neck ROM: Full    Dental no notable dental hx.    Pulmonary neg pulmonary ROS,    Pulmonary exam normal breath sounds clear to auscultation       Cardiovascular negative cardio ROS Normal cardiovascular exam Rhythm:Regular Rate:Normal     Neuro/Psych negative neurological ROS  negative psych ROS   GI/Hepatic negative GI ROS, Neg liver ROS,   Endo/Other  negative endocrine ROS  Renal/GU negative Renal ROS  negative genitourinary   Musculoskeletal negative musculoskeletal ROS (+)   Abdominal   Peds  Hematology  (+) Blood dyscrasia, anemia ,   Anesthesia Other Findings   Reproductive/Obstetrics (+) Pregnancy                             Anesthesia Physical Anesthesia Plan  ASA: II  Anesthesia Plan: Epidural   Post-op Pain Management:    Induction:   PONV Risk Score and Plan: Treatment may vary due to age or medical condition  Airway Management Planned: Natural Airway  Additional Equipment:   Intra-op Plan:   Post-operative Plan:   Informed Consent: I have reviewed the patients History and Physical, chart, labs and discussed the procedure including the risks, benefits and alternatives for the proposed anesthesia with the patient or authorized representative who has indicated his/her understanding and acceptance.       Plan Discussed with: Anesthesiologist  Anesthesia Plan Comments: (Patient identified. Risks, benefits, options discussed with patient including but not limited to bleeding, infection, nerve damage, paralysis, failed block, incomplete pain control, headache, blood pressure changes, nausea, vomiting, reactions to medication, itching, and  post partum back pain. Confirmed with bedside nurse the patient's most recent platelet count. Confirmed with the patient that they are not taking any anticoagulation, have any bleeding history or any family history of bleeding disorders. Patient expressed understanding and wishes to proceed. All questions were answered. )        Anesthesia Quick Evaluation  

## 2019-11-10 ENCOUNTER — Encounter (HOSPITAL_COMMUNITY): Payer: Self-pay | Admitting: Student

## 2019-11-10 DIAGNOSIS — Z6791 Unspecified blood type, Rh negative: Secondary | ICD-10-CM

## 2019-11-10 DIAGNOSIS — O26899 Other specified pregnancy related conditions, unspecified trimester: Secondary | ICD-10-CM

## 2019-11-10 LAB — RPR: RPR Ser Ql: NONREACTIVE

## 2019-11-10 MED ORDER — WITCH HAZEL-GLYCERIN EX PADS
1.0000 "application " | MEDICATED_PAD | CUTANEOUS | Status: DC | PRN
  Administered 2019-11-11: 1 via TOPICAL

## 2019-11-10 MED ORDER — BENZOCAINE-MENTHOL 20-0.5 % EX AERO
1.0000 "application " | INHALATION_SPRAY | CUTANEOUS | Status: DC | PRN
  Administered 2019-11-11: 1 via TOPICAL
  Filled 2019-11-10: qty 56

## 2019-11-10 MED ORDER — IBUPROFEN 600 MG PO TABS
600.0000 mg | ORAL_TABLET | Freq: Four times a day (QID) | ORAL | Status: DC
  Administered 2019-11-10 – 2019-11-11 (×6): 600 mg via ORAL
  Filled 2019-11-10 (×6): qty 1

## 2019-11-10 MED ORDER — SIMETHICONE 80 MG PO CHEW: 80.0000 mg | CHEWABLE_TABLET | ORAL | Status: DC | PRN

## 2019-11-10 MED ORDER — ACETAMINOPHEN 325 MG PO TABS
650.0000 mg | ORAL_TABLET | ORAL | Status: DC | PRN
  Administered 2019-11-10: 650 mg via ORAL
  Filled 2019-11-10: qty 2

## 2019-11-10 MED ORDER — ONDANSETRON HCL 4 MG PO TABS: 4.0000 mg | ORAL_TABLET | ORAL | Status: DC | PRN

## 2019-11-10 MED ORDER — PRENATAL MULTIVITAMIN CH
1.0000 | ORAL_TABLET | Freq: Every day | ORAL | Status: DC
  Administered 2019-11-10 – 2019-11-11 (×2): 1 via ORAL
  Filled 2019-11-10 (×2): qty 1

## 2019-11-10 MED ORDER — TETANUS-DIPHTH-ACELL PERTUSSIS 5-2.5-18.5 LF-MCG/0.5 IM SUSP: 0.5000 mL | Freq: Once | INTRAMUSCULAR | Status: DC

## 2019-11-10 MED ORDER — ONDANSETRON HCL 4 MG/2ML IJ SOLN: 4.0000 mg | INTRAMUSCULAR | Status: DC | PRN

## 2019-11-10 MED ORDER — COCONUT OIL OIL: 1.0000 "application " | TOPICAL_OIL | Status: DC | PRN

## 2019-11-10 MED ORDER — ZOLPIDEM TARTRATE 5 MG PO TABS: 5.0000 mg | ORAL_TABLET | Freq: Every evening | ORAL | Status: DC | PRN

## 2019-11-10 MED ORDER — DIPHENHYDRAMINE HCL 25 MG PO CAPS: 25.0000 mg | ORAL_CAPSULE | Freq: Four times a day (QID) | ORAL | Status: DC | PRN

## 2019-11-10 MED ORDER — SENNOSIDES-DOCUSATE SODIUM 8.6-50 MG PO TABS
2.0000 | ORAL_TABLET | ORAL | Status: DC
  Administered 2019-11-10 – 2019-11-11 (×2): 2 via ORAL
  Filled 2019-11-10 (×2): qty 2

## 2019-11-10 MED ORDER — DIBUCAINE (PERIANAL) 1 % EX OINT
1.0000 "application " | TOPICAL_OINTMENT | CUTANEOUS | Status: DC | PRN
  Administered 2019-11-11: 1 via RECTAL
  Filled 2019-11-10: qty 28

## 2019-11-10 NOTE — Clinical Social Work Maternal (Signed)
CLINICAL SOCIAL WORK MATERNAL/CHILD NOTE  Patient Details  Name: Maria Bowman MRN: [id number] Date of Birth: 04-02-1986  Date:  11/10/2019  Clinical Social Worker Initiating Note:  Laurey Arrow Date/Time: Initiated:  11/10/19/1100     Child's Name:  Maria Bowman   Biological Parents:  Mother(MOB refused to provide any information about FOB and reported that FOB will not be providing any supports.)   Need for Interpreter:  None   Reason for Referral:  Behavioral Health Concerns   Address:  [address] [place] [address]    Phone number:  [phone] (home)     Additional phone number:   Household Members/Support Persons (HM/SP):   Household Member/Support Person 1, Household Member/Support Person 2(MOB is a current resident of [facility].)   HM/SP Name Relationship DOB or Age  HM/SP -1 Maria Bowman daughter 06/30/2002  HM/SP -2 Maria Bowman son 04/19/2000  HM/SP -3        HM/SP -4        HM/SP -5        HM/SP -6        HM/SP -7        HM/SP -8          Natural Supports (not living in the home):      Professional Supports: Case Manager/Social Worker, Shelter(MOB resides at [facility].)   Employment: Part-time   Type of Work: Scientist, water quality at [employer].   Education:  Public librarian arranged:    Museum/gallery curator Resources:  Medicaid   Other Resources:  (CSW provided MOB with information to apply for [employer] and Physicist, medical.)   Cultural/Religious Considerations Which May Impact Care:  Per MOB's Face Sheet, MOB is Non-Denominational.  Strengths:  Ability to meet basic needs , Home prepared for child    Psychotropic Medications:         Pediatrician:       Pediatrician List:   [facility]      Pediatrician Fax Number:    Risk Factors/Current Problems:  Substance Use    Cognitive State:  Able to Concentrate , Alert , Linear Thinking     Mood/Affect:  Apprehensive , Comfortable , Relaxed , Calm    CSW Assessment: CSW met with MOB in room 518 to complete an assessment for hx of substance use.  When CSW arrived, MOB was attaching and bonding with infant as evident by MOB engaging in skin to skin. MOB gave CSW permission to complete the assessment while MOB's daughter Maria Bowman) was asleep on the couch. MOB appeared apprehensive about meeting with CSW however was easy to engage and was receptive to answering questions during the assessment.   CSW inquired about MOB's substance use and MOB acknowledged the use of marijuana prior to her pregnancy confirmation.  MOB was adamant that she stopped using after confirming she was pregnant; MOB also denied the use of all other illicit substances. CSW offered MOB SA resources and MOB declined.  CSW informed MOB of the hospital's policy and procedures regarding perinatal substance exposure. MOB was made aware of the 2 drug screenings for infant.  MOB was understanding and did not have any questions or concerns. CSW informed MOB that the infant's UDS and CDS are being monitored and CSW will make a report to [place] if  either are positive. MOB acknowledged CPS hx and reported most recent case was "Early 2019." Per MOB, MOB's case was closed after an investigation was completed.   MOB reports having all essential items for infant including car seat and a bassinet. MOB denied barriers to follow-up appointments after discharge.   MOB is a currently a resident of [facility] where she receives local resources and additional supports.   CSW Plan/Description:  No Further Intervention Required/No Barriers to Discharge, Sudden Infant Death Syndrome (SIDS) Education, Perinatal Mood and Anxiety Disorder (PMADs) Education, Other Patient/Family Education, [place], CSW Will Continue to Monitor Umbilical Cord Tissue Drug Screen Results and Make Report if  Warranted   Laurey Arrow, MSW, LCSW Clinical Social Work [phone]  Dimple Nanas, LCSW 11/10/2019, 12:52 PM

## 2019-11-10 NOTE — Lactation Note (Signed)
This note was copied from a baby's chart. Lactation Consultation Note:  P3, mother reports that she formula fed other children . Children are 17 and 19.  Mother reports that she wanted to try breastfeeding. She reports that infant latched at birth. Mother reports that infant is latching well.   Infant in crib starting to cue . Assist mother with football hold. Infant latched well with good depth. Infant sustained latch for 20 mins. Observed frequent suckling and swallows.   Discussed supply and demand.   Advised mother to cue base feed and to feed infant 8-12 times or more in 24 hours. Discussed cluster feeding. Mother to frequent STS.  Basic breastfeeding teaching done. Lactation brochure given and informed   Patient Name: Maria Bowman [id number] Date: 11/10/2019 Reason for consult: Initial assessment   Maternal Data Has patient been taught Hand Expression?: Yes Does the patient have breastfeeding experience prior to this delivery?: No(mother formula fed a 95 and 74 yr old)  Feeding Feeding Type: Breast Fed  LATCH Score Latch: Grasps breast easily, tongue down, lips flanged, rhythmical sucking.  Audible Swallowing: Spontaneous and intermittent  Type of Nipple: Everted at rest and after stimulation  Comfort (Breast/Nipple): Soft / non-tender  Hold (Positioning): Assistance needed to correctly position infant at breast and maintain latch.  LATCH Score: 9  Interventions Interventions: Breast feeding basics reviewed;Assisted with latch;Skin to skin;Hand express;Breast compression;Adjust position;Support pillows;Position options  Lactation Tools Discussed/Used     Consult Status Consult Status: Follow-up Date: 11/11/19 Follow-up type: In-patient    Jess Barters [facility] 11/10/2019, 4:13 PM

## 2019-11-10 NOTE — Anesthesia Postprocedure Evaluation (Signed)
Anesthesia Post Note  Patient: Maria Bowman  Procedure(s) Performed: AN AD [place]     Patient location during evaluation: Mother Baby Anesthesia Type: Epidural Level of consciousness: awake and alert Pain management: pain level controlled Vital Signs Assessment: post-procedure vital signs reviewed and stable Respiratory status: spontaneous breathing, nonlabored ventilation and respiratory function stable Cardiovascular status: stable Postop Assessment: no headache, no backache and epidural receding Anesthetic complications: no    Last Vitals:  Vitals:   11/10/19 0420 11/10/19 0805  BP: 129/76 138/87  Pulse: 67 62  Resp: 20 18  Temp: 37.6 C 36.8 C  SpO2: 94% 100%    Last Pain:  Vitals:   11/10/19 0805  TempSrc: Oral  PainSc:    Pain Goal:                   Rayvon Char

## 2019-11-10 NOTE — Lactation Note (Signed)
This note was copied from a baby's chart. Lactation Consultation Note LC attempted to see mom, but she was sleeping soundly.  Patient Name: Maria Bowman [id number] Date: 11/10/2019     Maternal Data    Feeding Feeding Type: Breast Fed  LATCH Score                   Interventions    Lactation Tools Discussed/Used     Consult Status      Theodoro Kalata 11/10/2019, 5:51 AM

## 2019-11-10 NOTE — Progress Notes (Signed)
Post Partum Day 1  Subjective:  Maria Bowman is a 33 y.o. [id number] [redacted]w[redacted]d s/p SCD.  No acute events overnight.  Pt denies problems with ambulating, voiding or po intake.  She denies nausea or vomiting.  Pain is well controlled.  She has had flatus. She has not had bowel movement.  Lochia Small.  Plan for birth control is abstinence.  Method of Feeding: breast  Objective: BP 138/87 (BP Location: Right Arm)   Pulse 62   Temp 99.7 F (37.6 C) (Oral)   Resp 18   LMP 02/03/2019   SpO2 100%   Breastfeeding Unknown   Physical Exam:  General: alert, cooperative and no distress Lochia:normal flow Chest: normal work of breathing Heart: regular rate Abdomen: soft, nontender Uterine Fundus: firm DVT Evaluation: No evidence of DVT seen on physical exam.   Recent Labs    11/09/19 2048  HGB 13.2  HCT 38.1    Assessment/Plan:  ASSESSMENT: Maria Bowman is a 33 y.o. [id number] [redacted]w[redacted]d ppd #1 s/p NSVD/VAVD/FAVD doing well.   Plan for discharge tomorrow, Breastfeeding and Contraception option reviewed, would like to pursue abstinence Continue routine PP care   LOS: 1 day   Demetrius Revel, MD 11/10/2019, 8:06 AM

## 2019-11-10 NOTE — Discharge Summary (Signed)
Postpartum Discharge Summary     Patient Name: Maria Bowman DOB: 1986/08/01 MRN: [id number]  Date of admission: 11/09/2019 Delivering Provider: Sharene Butters D   Date of discharge: 11/11/2019  Admitting diagnosis: Full-term premature rupture of membranes [O42.92] Intrauterine pregnancy: [redacted]w[redacted]d    Secondary diagnosis:  Active Problems:   Anemia affecting pregnancy   Full-term premature rupture of membranes   SVD (spontaneous vaginal delivery)   Rh negative status during pregnancy  Additional problems: hx of PPH     Discharge diagnosis: Term Pregnancy Delivered                                                                                                Post partum procedures:rhogam  Augmentation: none  Complications: None  Hospital course:  Onset of Labor With Vaginal Delivery     33y.o. yo [id number] 476w3das admitted in Active Labor on 11/09/2019. Patient had an uncomplicated labor course as follows:  Membrane Rupture Time/Date: 8:00 PM ,11/09/2019   Intrapartum Procedures: Episiotomy: None [1]                                         Lacerations:  1st degree [2];Perineal [11]  Patient had a delivery of a Viable infant. 11/09/2019  Information for the patient's newborn:  BrAdreona, Brand0[[id number]]Delivery Method: Vaginal, Spontaneous(Filed from Delivery Summary)     Pateint had an uncomplicated postpartum course.  She is ambulating, tolerating a regular diet, passing flatus, and urinating well. Patient is discharged home in stable condition on 11/11/19.  Delivery time: 11:02 PM    Magnesium Sulfate received: No BMZ received: No Rhophylac:No MMR:N/A Transfusion:No  Physical exam  Vitals:   11/10/19 1400 11/10/19 2220 11/11/19 0508 11/11/19 1100  BP: 121/80 116/75 (!) 141/88 113/62  Pulse: 77 68 (!) 58 (!) 59  Resp: 18 18 16    Temp: 98.5 F (36.9 C) 98.2 F (36.8 C) 98.4 F (36.9 C)   TempSrc:   Oral   SpO2:      Height:       General: alert,  cooperative and no distress Lochia: appropriate Uterine Fundus: firm Incision: N/A DVT Evaluation: No evidence of DVT seen on physical exam. No cords or calf tenderness. No significant calf/ankle edema. Labs: Lab Results  Component Value Date   WBC 8.7 11/09/2019   HGB 13.2 11/09/2019   HCT 38.1 11/09/2019   MCV 84.1 11/09/2019   PLT 193 11/09/2019   CMP Latest Ref Rng & Units 08/29/2018  Glucose 70 - 99 mg/dL 87  BUN 6 - 20 mg/dL 15  Creatinine 0.44 - 1.00 mg/dL 0.72  Sodium 135 - 145 mmol/L 136  Potassium 3.5 - 5.1 mmol/L 3.4(L)  Chloride 98 - 111 mmol/L 106  CO2 22 - 32 mmol/L 20(L)  Calcium 8.9 - 10.3 mg/dL 9.6  Total Protein - -  Total Bilirubin - -  Alkaline Phos - -  AST - -  ALT - -  Discharge instruction: per After Visit Summary and "Baby and Me Booklet".  After visit meds:  Allergies as of 11/11/2019   No Known Allergies     Medication List    STOP taking these medications   iron polysaccharides 150 MG capsule Commonly known as: NIFEREX     TAKE these medications   acetaminophen 325 MG tablet Commonly known as: Tylenol Take 2 tablets (650 mg total) by mouth every 6 (six) hours as needed.   ibuprofen 600 MG tablet Commonly known as: ADVIL Take 1 tablet (600 mg total) by mouth every 6 (six) hours as needed.   PrePLUS 27-1 MG Tabs Take 1 tablet by mouth daily.   senna-docusate 8.6-50 MG tablet Commonly known as: Senokot-S Take 2 tablets by mouth at bedtime as needed for mild constipation.       Diet: routine diet  Activity: Advance as tolerated. Pelvic rest for 6 weeks.   Outpatient follow up:4 weeks Follow up Appt: Future Appointments  Date Time Provider [place]  11/13/2019  1:50 PM [place]  12/10/2019  2:15 PM Tresea Mall, CNM [facility]   Follow up Visit: [place] for [facility]. Schedule an appointment as soon as possible for a visit in 2 day(s).    Specialty: Obstetrics and Gynecology Why: 2 days for BP check. 4weeks for ppartum visit. Contact information: [address] [place], [place] [id number] [place] [address] [phone]           Please schedule this patient for Postpartum visit in: 4 weeks with the following provider: Any provider For C/S patients schedule nurse incision check in weeks 2 weeks: no Low risk pregnancy complicated by: Rh negative during preg Delivery mode:  SVD Anticipated Birth Control:  other/unsure PP Procedures needed: none  Schedule Integrated BH visit: no  Pt had single elevated BP this morning with normal repeat. Per discussion with Dr. Kennon Rounds, no BP meds to take home, but needs BP check in clinic in two days. Message sent to [facility] with high importance to call patient to schedule.  Newborn Data: Live born female  Birth Weight: 8 lb 9.9 oz (3910 g) APGAR: 8, 9  Newborn Delivery   Birth date/time: 11/09/2019 23:02:00 Delivery type: Vaginal, Spontaneous      Baby Feeding: Breast Disposition:home with mother   11/11/2019 Clarisa Fling, NP

## 2019-11-11 ENCOUNTER — Other Ambulatory Visit (HOSPITAL_COMMUNITY): Payer: Medicaid Other

## 2019-11-11 ENCOUNTER — Other Ambulatory Visit: Payer: Medicaid Other

## 2019-11-11 MED ORDER — IBUPROFEN 600 MG PO TABS: 600.0000 mg | ORAL_TABLET | Freq: Four times a day (QID) | ORAL | 0 refills | Status: DC | PRN

## 2019-11-11 MED ORDER — SENNOSIDES-DOCUSATE SODIUM 8.6-50 MG PO TABS: 2.0000 | ORAL_TABLET | Freq: Every evening | ORAL | 0 refills | Status: AC | PRN

## 2019-11-11 MED ORDER — IBUPROFEN 600 MG PO TABS: 600.0000 mg | ORAL_TABLET | Freq: Four times a day (QID) | ORAL | 0 refills | Status: AC | PRN

## 2019-11-11 MED ORDER — RHO D IMMUNE GLOBULIN 1500 UNIT/2ML IJ SOSY
300.0000 ug | PREFILLED_SYRINGE | Freq: Once | INTRAMUSCULAR | Status: AC
  Administered 2019-11-11: 300 ug via INTRAMUSCULAR
  Filled 2019-11-11: qty 2

## 2019-11-11 MED ORDER — ACETAMINOPHEN 325 MG PO TABS: 650.0000 mg | ORAL_TABLET | Freq: Four times a day (QID) | ORAL | 0 refills | Status: DC | PRN

## 2019-11-11 MED ORDER — SENNOSIDES-DOCUSATE SODIUM 8.6-50 MG PO TABS: 2.0000 | ORAL_TABLET | Freq: Every evening | ORAL | 0 refills | Status: DC | PRN

## 2019-11-11 MED FILL — SENEXON-S 8.6-50 MG TABS: 8.6-50 | 15 days supply | Qty: 30 | Fill #0

## 2019-11-11 MED FILL — ACETAMINOPHEN 325 MG TABS: 325 | 8 days supply | Qty: 30 | Fill #0

## 2019-11-11 MED FILL — IBUPROFEN 600 MG TABLET: 600 | 8 days supply | Qty: 30 | Fill #0

## 2019-11-11 NOTE — Lactation Note (Signed)
This note was copied from a baby's chart. Lactation Consultation Note  Patient Name: Maria Bowman [id number] Date: 11/11/2019 Reason for consult: Follow-up assessment Baby is 35 hours old/5% weight loss.  Mom reports that baby is cluster feeding.  Some discomfort with feeding.  Mom using the cradle hold on the right and football hold on the left.  She states the left side feels awkward.  Assisted with cross cradle on the left.  Baby latched easily and mom states baby has more breast tissue in her mouth.  Observed a good feeding but mom feels she likes football hold better.  Encouraged to use the position that is most comfortable.  Discussed milk coming to volume and the prevention and treatment of engorgement.  She does have a breast pump at home.  Reviewed outpatient services and encouraged to call prn.  Maternal Data    Feeding Feeding Type: Breast Fed  LATCH Score Latch: Grasps breast easily, tongue down, lips flanged, rhythmical sucking.  Audible Swallowing: A few with stimulation  Type of Nipple: Everted at rest and after stimulation  Comfort (Breast/Nipple): Filling, red/small blisters or bruises, mild/mod discomfort  Hold (Positioning): Assistance needed to correctly position infant at breast and maintain latch.  LATCH Score: 7  Interventions Interventions: Adjust position;Assisted with latch;Support pillows;Position options  Lactation Tools Discussed/Used     Consult Status Consult Status: Complete Follow-up type: Call as needed    Ave Filter 11/11/2019, 11:00 AM

## 2019-11-11 NOTE — Discharge Instructions (Signed)
Postpartum Care After Vaginal Delivery This sheet gives you information about how to care for yourself from the time you deliver your baby to up to 6-12 weeks after delivery (postpartum period). Your health care provider may also give you more specific instructions. If you have problems or questions, contact your health care provider. Follow these instructions at home: Vaginal bleeding  It is normal to have vaginal bleeding (lochia) after delivery. Wear a sanitary pad for vaginal bleeding and discharge. ? During the first week after delivery, the amount and appearance of lochia is often similar to a menstrual period. ? Over the next few weeks, it will gradually decrease to a dry, yellow-[place] discharge. ? For most women, lochia stops completely by 4-6 weeks after delivery. Vaginal bleeding can vary from woman to woman.  Change your sanitary pads frequently. Watch for any changes in your flow, such as: ? A sudden increase in volume. ? A change in color. ? Large blood clots.  If you pass a blood clot from your vagina, save it and call your health care provider to discuss. Do not flush blood clots down the toilet before talking with your health care provider.  Do not use tampons or douches until your health care provider says this is safe.  If you are not breastfeeding, your period should return 6-8 weeks after delivery. If you are feeding your child breast milk only (exclusive breastfeeding), your period may not return until you stop breastfeeding. Perineal care  Keep the area between the vagina and the anus (perineum) clean and dry as told by your health care provider. Use medicated pads and pain-relieving sprays and creams as directed.  If you had a cut in the perineum (episiotomy) or a tear in the vagina, check the area for signs of infection until you are healed. Check for: ? More redness, swelling, or pain. ? Fluid or blood coming from the cut or tear. ? Warmth. ? Pus or a bad  smell.  You may be given a squirt bottle to use instead of wiping to clean the perineum area after you go to the bathroom. As you start healing, you may use the squirt bottle before wiping yourself. Make sure to wipe gently.  To relieve pain caused by an episiotomy, a tear in the vagina, or swollen veins in the anus (hemorrhoids), try taking a warm sitz bath 2-3 times a day. A sitz bath is a warm water bath that is taken while you are sitting down. The water should only come up to your hips and should cover your buttocks. Breast care  Within the first few days after delivery, your breasts may feel heavy, full, and uncomfortable (breast engorgement). Milk may also leak from your breasts. Your health care provider can suggest ways to help relieve the discomfort. Breast engorgement should go away within a few days.  If you are breastfeeding: ? Wear a bra that supports your breasts and fits you well. ? Keep your nipples clean and dry. Apply creams and ointments as told by your health care provider. ? You may need to use breast pads to absorb milk that leaks from your breasts. ? You may have uterine contractions every time you breastfeed for up to several weeks after delivery. Uterine contractions help your uterus return to its normal size. ? If you have any problems with breastfeeding, work with your health care provider or lactation consultant.  If you are not breastfeeding: ? Avoid touching your breasts a lot. Doing this can make   your breasts produce more milk. ? Wear a good-fitting bra and use cold packs to help with swelling. ? Do not squeeze out (express) milk. This causes you to make more milk. Intimacy and sexuality  Ask your health care provider when you can engage in sexual activity. This may depend on: ? Your risk of infection. ? How fast you are healing. ? Your comfort and desire to engage in sexual activity.  You are able to get pregnant after delivery, even if you have not had  your period. If desired, talk with your health care provider about methods of birth control (contraception). Medicines  Take over-the-counter and prescription medicines only as told by your health care provider.  If you were prescribed an antibiotic medicine, take it as told by your health care provider. Do not stop taking the antibiotic even if you start to feel better. Activity  Gradually return to your normal activities as told by your health care provider. Ask your health care provider what activities are safe for you.  Rest as much as possible. Try to rest or take a nap while your baby is sleeping. Eating and drinking   Drink enough fluid to keep your urine pale yellow.  Eat high-fiber foods every day. These may help prevent or relieve constipation. High-fiber foods include: ? Whole grain cereals and breads. ? [employer] rice. ? Beans. ? Fresh fruits and vegetables.  Do not try to lose weight quickly by cutting back on calories.  Take your prenatal vitamins until your postpartum checkup or until your health care provider tells you it is okay to stop. Lifestyle  Do not use any products that contain nicotine or tobacco, such as cigarettes and e-cigarettes. If you need help quitting, ask your health care provider.  Do not drink alcohol, especially if you are breastfeeding. General instructions  Keep all follow-up visits for you and your baby as told by your health care provider. Most women visit their health care provider for a postpartum checkup within the first 3-6 weeks after delivery. Contact a health care provider if:  You feel unable to cope with the changes that your child brings to your life, and these feelings do not go away.  You feel unusually sad or worried.  Your breasts become red, painful, or hard.  You have a fever.  You have trouble holding urine or keeping urine from leaking.  You have little or no interest in activities you used to enjoy.  You have not  breastfed at all and you have not had a menstrual period for 12 weeks after delivery.  You have stopped breastfeeding and you have not had a menstrual period for 12 weeks after you stopped breastfeeding.  You have questions about caring for yourself or your baby.  You pass a blood clot from your vagina. Get help right away if:  You have chest pain.  You have difficulty breathing.  You have sudden, severe leg pain.  You have severe pain or cramping in your lower abdomen.  You bleed from your vagina so much that you fill more than one sanitary pad in one hour. Bleeding should not be heavier than your heaviest period.  You develop a severe headache.  You faint.  You have blurred vision or spots in your vision.  You have bad-smelling vaginal discharge.  You have thoughts about hurting yourself or your baby. If you ever feel like you may hurt yourself or others, or have thoughts about taking your own life, get help  right away. You can go to the nearest emergency department or call:  Your local emergency services (911 in the [place]  A suicide crisis helpline, such as the National Suicide Prevention Lifeline at (289)047-3802. This is open 24 hours a day. Summary  The period of time right after you deliver your newborn up to 6-12 weeks after delivery is called the postpartum period.  Gradually return to your normal activities as told by your health care provider.  Keep all follow-up visits for you and your baby as told by your health care provider. This information is not intended to replace advice given to you by your health care provider. Make sure you discuss any questions you have with your health care provider. Document Released: 09/04/2007 Document Revised: 11/10/2017 Document Reviewed: 08/21/2017 Elsevier Patient Education  2020 [employer] Postpartum Hypertension Postpartum hypertension is high blood pressure that remains higher than normal after childbirth. You may  not realize that you have postpartum hypertension if your blood pressure is not being checked regularly. In most cases, postpartum hypertension will go away on its own, usually within a week of delivery. However, for some women, medical treatment is required to prevent serious complications, such as seizures or stroke. What are the causes? This condition may be caused by one or more of the following:  Hypertension that existed before pregnancy (chronic hypertension).  Hypertension that comes on as a result of pregnancy (gestational hypertension).  Hypertensive disorders during pregnancy (preeclampsia) or seizures in women who have high blood pressure during pregnancy (eclampsia).  A condition in which the liver, platelets, and red blood cells are damaged during pregnancy (HELLP syndrome).  A condition in which the thyroid produces too much hormones (hyperthyroidism).  Other rare problems of the nerves (neurological disorders) or blood disorders. In some cases, the cause may not be known. What increases the risk? The following factors may make you more likely to develop this condition:  Chronic hypertension. In some cases, this may not have been diagnosed before pregnancy.  Obesity.  Type 2 diabetes.  Kidney disease.  History of preeclampsia or eclampsia.  Other medical conditions that change the level of hormones in the body (hormonal imbalance). What are the signs or symptoms? As with all types of hypertension, postpartum hypertension may not have any symptoms. Depending on how high your blood pressure is, you may experience:  Headaches. These may be mild, moderate, or severe. They may also be steady, constant, or sudden in onset (thunderclap headache).  Changes in your ability to see (visual changes).  Dizziness.  Shortness of breath.  Swelling of your hands, feet, lower legs, or face. In some cases, you may have swelling in more than one of these locations.  Heart  palpitations or a racing heartbeat.  Difficulty breathing while lying down.  Decrease in the amount of urine that you pass. Other rare signs and symptoms may include:  Sweating more than usual. This lasts longer than a few days after delivery.  Chest pain.  Sudden dizziness when you get up from sitting or lying down.  Seizures.  Nausea or vomiting.  Abdominal pain. How is this diagnosed? This condition may be diagnosed based on the results of a physical exam, blood pressure measurements, and blood and urine tests. You may also have other tests, such as a CT scan or an MRI, to check for other problems of postpartum hypertension. How is this treated? If blood pressure is high enough to require treatment, your options may include:  Medicines to  reduce blood pressure (antihypertensives). Tell your health care provider if you are breastfeeding or if you plan to breastfeed. There are many antihypertensive medicines that are safe to take while breastfeeding.  Stopping medicines that may be causing hypertension.  Treating medical conditions that are causing hypertension.  Treating the complications of hypertension, such as seizures, stroke, or kidney problems. Your health care provider will also continue to monitor your blood pressure closely until it is within a safe range for you. Follow these instructions at home:  Take over-the-counter and prescription medicines only as told by your health care provider.  Return to your normal activities as told by your health care provider. Ask your health care provider what activities are safe for you.  Do not use any products that contain nicotine or tobacco, such as cigarettes and e-cigarettes. If you need help quitting, ask your health care provider.  Keep all follow-up visits as told by your health care provider. This is important. Contact a health care provider if:  Your symptoms get worse.  You have new symptoms, such as: ? A  headache that does not get better. ? Dizziness. ? Visual changes. Get help right away if:  You suddenly develop swelling in your hands, ankles, or face.  You have sudden, rapid weight gain.  You develop difficulty breathing, chest pain, racing heartbeat, or heart palpitations.  You develop severe pain in your abdomen.  You have any symptoms of a stroke. "BE FAST" is an easy way to remember the main warning signs of a stroke: ? B - Balance. Signs are dizziness, sudden trouble walking, or loss of balance. ? E - Eyes. Signs are trouble seeing or a sudden change in vision. ? F - Face. Signs are sudden weakness or numbness of the face, or the face or eyelid drooping on one side. ? A - Arms. Signs are weakness or numbness in an arm. This happens suddenly and usually on one side of the body. ? S - Speech. Signs are sudden trouble speaking, slurred speech, or trouble understanding what people say. ? T - Time. Time to call emergency services. Write down what time symptoms started.  You have other signs of a stroke, such as: ? A sudden, severe headache with no known cause. ? Nausea or vomiting. ? Seizure. These symptoms may represent a serious problem that is an emergency. Do not wait to see if the symptoms will go away. Get medical help right away. Call your local emergency services (911 in the [place] Do not drive yourself to the hospital. Summary  Postpartum hypertension is high blood pressure that remains higher than normal after childbirth.  In most cases, postpartum hypertension will go away on its own, usually within a week of delivery.  For some women, medical treatment is required to prevent serious complications, such as seizures or stroke. This information is not intended to replace advice given to you by your health care provider. Make sure you discuss any questions you have with your health care provider. Document Released: 07/11/2014 Document Revised: 12/14/2018 Document  Reviewed: 08/28/2017 Elsevier Patient Education  [place]. Preeclampsia and Eclampsia Preeclampsia is a serious condition that may develop during pregnancy. This condition causes high blood pressure and increased protein in your urine along with other symptoms, such as headaches and vision changes. These symptoms may develop as the condition gets worse. Preeclampsia may occur at 20 weeks of pregnancy or later. Diagnosing and treating preeclampsia early is very important. If not treated early, it  can cause serious problems for you and your baby. One problem it can lead to is eclampsia. Eclampsia is a condition that causes muscle jerking or shaking (convulsions or seizures) and other serious problems for the mother. During pregnancy, delivering your baby may be the best treatment for preeclampsia or eclampsia. For most women, preeclampsia and eclampsia symptoms go away after giving birth. In rare cases, a woman may develop preeclampsia after giving birth (postpartum preeclampsia). This usually occurs within 48 hours after childbirth but may occur up to 6 weeks after giving birth. What are the causes? The cause of preeclampsia is not known. What increases the risk? The following risk factors make you more likely to develop preeclampsia:  Being pregnant for the first time.  Having had preeclampsia during a past pregnancy.  Having a family history of preeclampsia.  Having high blood pressure.  Being pregnant with more than one baby.  Being 1835 or older.  Being African-American.  Having kidney disease or diabetes.  Having medical conditions such as lupus or blood diseases.  Being very overweight (obese). What are the signs or symptoms? The most common symptoms are:  Severe headaches.  Vision problems, such as blurred or double vision.  Abdominal pain, especially upper abdominal pain. Other symptoms that may develop as the condition gets worse include:  Sudden weight  gain.  Sudden swelling of the hands, face, legs, and feet.  Severe nausea and vomiting.  Numbness in the face, arms, legs, and feet.  Dizziness.  Urinating less than usual.  Slurred speech.  Convulsions or seizures. How is this diagnosed? There are no screening tests for preeclampsia. Your health care provider will ask you about symptoms and check for signs of preeclampsia during your prenatal visits. You may also have tests that include:  Checking your blood pressure.  Urine tests to check for protein. Your health care provider will check for this at every prenatal visit.  Blood tests.  Monitoring your baby's heart rate.  Ultrasound. How is this treated? You and your health care provider will determine the treatment approach that is best for you. Treatment may include:  Having more frequent prenatal exams to check for signs of preeclampsia, if you have an increased risk for preeclampsia.  Medicine to lower your blood pressure.  Staying in the hospital, if your condition is severe. There, treatment will focus on controlling your blood pressure and the amount of fluids in your body (fluid retention).  Taking medicine (magnesium sulfate) to prevent seizures. This may be given as an injection or through an IV.  Taking a low-dose aspirin during your pregnancy.  Delivering your baby early. You may have your labor started with medicine (induced), or you may have a cesarean delivery. Follow these instructions at home: Eating and drinking   Drink enough fluid to keep your urine pale yellow.  Avoid caffeine. Lifestyle  Do not use any products that contain nicotine or tobacco, such as cigarettes and e-cigarettes. If you need help quitting, ask your health care provider.  Do not use alcohol or drugs.  Avoid stress as much as possible. Rest and get plenty of sleep. General instructions  Take over-the-counter and prescription medicines only as told by your health care  provider.  When lying down, lie on your left side. This keeps pressure off your major blood vessels.  When sitting or lying down, raise (elevate) your feet. Try putting some pillows underneath your lower legs.  Exercise regularly. Ask your health care provider what kinds of exercise  are best for you.  Keep all follow-up and prenatal visits as told by your health care provider. This is important. How is this prevented? There is no known way of preventing preeclampsia or eclampsia from developing. However, to lower your risk of complications and detect problems early:  Get regular prenatal care. Your health care provider may be able to diagnose and treat the condition early.  Maintain a healthy weight. Ask your health care provider for help managing weight gain during pregnancy.  Work with your health care provider to manage any long-term (chronic) health conditions you have, such as diabetes or kidney problems.  You may have tests of your blood pressure and kidney function after giving birth.  Your health care provider may have you take low-dose aspirin during your next pregnancy. Contact a health care provider if:  You have symptoms that your health care provider told you may require more treatment or monitoring, such as: ? Headaches. ? Nausea or vomiting. ? Abdominal pain. ? Dizziness. ? Light-headedness. Get help right away if:  You have severe: ? Abdominal pain. ? Headaches that do not get better. ? Dizziness. ? Vision problems. ? Confusion. ? Nausea or vomiting.  You have any of the following: ? A seizure. ? Sudden, rapid weight gain. ? Sudden swelling in your hands, ankles, or face. ? Trouble moving any part of your body. ? Numbness in any part of your body. ? Trouble speaking. ? Abnormal bleeding.  You faint. Summary  Preeclampsia is a serious condition that may develop during pregnancy.  This condition causes high blood pressure and increased protein in  your urine along with other symptoms, such as headaches and vision changes.  Diagnosing and treating preeclampsia early is very important. If not treated early, it can cause serious problems for you and your baby.  Get help right away if you have symptoms that your health care provider told you to watch for. This information is not intended to replace advice given to you by your health care provider. Make sure you discuss any questions you have with your health care provider. Document Released: 11/04/2000 Document Revised: 07/10/2018 Document Reviewed: 06/13/2016 Elsevier Patient Education  2020 [employer].

## 2019-11-12 ENCOUNTER — Ambulatory Visit: Payer: Self-pay

## 2019-11-12 LAB — RH IG WORKUP (INCLUDES ABO/RH)
ABO/RH(D): O NEG
Fetal Screen: NEGATIVE
Gestational Age(Wks): 40.3
Unit division: 0

## 2019-11-12 NOTE — Lactation Note (Signed)
This note was copied from a baby's chart. Lactation Consultation Note:  [place] arrived in room and mother breastfeeding infant in cradle hold without pillow support. Observed that infant was tugging on the nipple.   Assist mother with positioning infant on good pillow support and pillow to mothers back.  Reviewed proper latch for better depth. Assist with latching infant on in cross cradle hold and transitioning to cradle. Infant sustained latch for 20 mins.   Mother was given a harmony hand pump and discussed use when breast are firm and full to pre-pump.  Discussed treatment and prevention of engorgement.   Mother advised to breastfeed infant 8-12 times or more in 24 hours. Advised mother to follow up with [place] as needed.   Discussed cluster feeding that is a normal newborn behavior.  Mother is aware of available [facility] services at [facility] and in the community.  Patient Name: Maria Bowman [id number] Date: 11/12/2019 Reason for consult: Follow-up assessment   Maternal Data    Feeding Feeding Type: Breast Fed  LATCH Score Latch: Grasps breast easily, tongue down, lips flanged, rhythmical sucking.  Audible Swallowing: Spontaneous and intermittent  Type of Nipple: Everted at rest and after stimulation  Comfort (Breast/Nipple): Filling, red/small blisters or bruises, mild/mod discomfort  Hold (Positioning): Assistance needed to correctly position infant at breast and maintain latch.  LATCH Score: 8  Interventions Interventions: Breast compression;Support pillows;Position options;Hand pump;Ice  Lactation Tools Discussed/Used     Consult Status Consult Status: Complete    Darla Lesches 11/12/2019, 11:35 AM

## 2019-11-13 ENCOUNTER — Inpatient Hospital Stay (HOSPITAL_COMMUNITY): Payer: Medicaid Other

## 2019-11-13 ENCOUNTER — Other Ambulatory Visit: Payer: Self-pay

## 2019-11-13 ENCOUNTER — Inpatient Hospital Stay (HOSPITAL_COMMUNITY)
Admission: AD | Admit: 2019-11-13 | Payer: Medicaid Other | Source: Home / Self Care | Admitting: Obstetrics & Gynecology

## 2019-11-13 ENCOUNTER — Ambulatory Visit (INDEPENDENT_AMBULATORY_CARE_PROVIDER_SITE_OTHER): Payer: Medicaid Other | Admitting: Medical

## 2019-11-13 VITALS — BP 128/82 | HR 71 | Wt 146.9 lb

## 2019-11-13 DIAGNOSIS — K644 Residual hemorrhoidal skin tags: Secondary | ICD-10-CM

## 2019-11-13 LAB — BPAM RBC
Blood Product Expiration Date: 202101152359
Blood Product Expiration Date: 202101172359
Unit Type and Rh: 9500
Unit Type and Rh: 9500

## 2019-11-13 LAB — TYPE AND SCREEN
ABO/RH(D): O NEG
Antibody Screen: POSITIVE
Unit division: 0
Unit division: 0

## 2019-11-13 MED ORDER — WITCH HAZEL-GLYCERIN EX PADS: 1.0000 "application " | MEDICATED_PAD | CUTANEOUS | 12 refills | Status: AC | PRN

## 2019-11-13 MED ORDER — PROCTOFOAM HC 1-1 % EX FOAM: 1.0000 | Freq: Two times a day (BID) | CUTANEOUS | 1 refills | Status: AC

## 2019-11-13 NOTE — Progress Notes (Signed)
History:  [name] is a 33 y.o. [id number] who presents to clinic today for BP check at 1 week PP. The patient states significant pain and swelling from external hemorrhoids.    The following portions of the patient's history were reviewed and updated as appropriate: allergies, current medications, family history, past medical history, social history, past surgical history and problem list.  Review of Systems:  Review of Systems  Constitutional: Negative for fever.  Genitourinary:       + vaginal bleeding      Objective:  Physical Exam BP 128/82   Pulse 71   Wt 146 lb 14.4 oz (66.6 kg)   LMP 02/03/2019   Breastfeeding Yes   BMI 27.76 kg/m  Physical Exam  Nursing note and vitals reviewed. Constitutional: She is oriented to person, place, and time. She appears well-developed and well-nourished. No distress.  HENT:  Head: Normocephalic and atraumatic.  Cardiovascular: Normal rate.  Respiratory: Effort normal.  GI: Soft. She exhibits no distension.  Genitourinary: Rectum:     External hemorrhoid (large, non-thrombosed, no active bleeding) present.   Neurological: She is alert and oriented to person, place, and time.  Skin: Skin is warm and dry. No erythema.  Psychiatric: She has a normal mood and affect.   MDM Discussed patient with Dr. Kennon Rounds. Continue Tucks pads regularly, colace and add Proctofoam.  Refer to general surgery if not improved in 1-2 weeks.   Assessment & Plan:  1. Hemorrhoids, external - hydrocortisone-pramoxine (PROCTOFOAM HC) rectal foam; Place 1 applicator rectally 2 (two) times daily.  Dispense: 10 g; Refill: 1 - witch hazel-glycerin (TUCKS) pad; Apply 1 application topically as needed for itching.  Dispense: 40 each; Refill: 12 - Continue colace and increased PO hydration to avoid need to strain to have a BM - Continue using topical anesthetic spray  - RTC for routine PP visit as scheduled   Luvenia Redden, PA-C 11/13/2019 2:30 PM

## 2019-11-13 NOTE — Patient Instructions (Signed)
Hemorrhoids Hemorrhoids are swollen veins that may develop:  In the butt (rectum). These are called internal hemorrhoids.  Around the opening of the butt (anus). These are called external hemorrhoids. Hemorrhoids can cause pain, itching, or bleeding. Most of the time, they do not cause serious problems. They usually get better with diet changes, lifestyle changes, and other home treatments. What are the causes? This condition may be caused by:  Having trouble pooping (constipation).  Pushing hard (straining) to poop.  Watery poop (diarrhea).  Pregnancy.  Being very overweight (obese).  Sitting for long periods of time.  Heavy lifting or other activity that causes you to strain.  Anal sex.  Riding a bike for a long period of time. What are the signs or symptoms? Symptoms of this condition include:  Pain.  Itching or soreness in the butt.  Bleeding from the butt.  Leaking poop.  Swelling in the area.  One or more lumps around the opening of your butt. How is this diagnosed? A doctor can often diagnose this condition by looking at the affected area. The doctor may also:  Do an exam that involves feeling the area with a gloved hand (digital rectal exam).  Examine the area inside your butt using a small tube (anoscope).  Order blood tests. This may be done if you have lost a lot of blood.  Have you get a test that involves looking inside the colon using a flexible tube with a camera on the end (sigmoidoscopy or colonoscopy). How is this treated? This condition can usually be treated at home. Your doctor may tell you to change what you eat, make lifestyle changes, or try home treatments. If these do not help, procedures can be done to remove the hemorrhoids or make them smaller. These may involve:  Placing rubber bands at the base of the hemorrhoids to cut off their blood supply.  Injecting medicine into the hemorrhoids to shrink them.  Shining a type of light  energy onto the hemorrhoids to cause them to fall off.  Doing surgery to remove the hemorrhoids or cut off their blood supply. Follow these instructions at home: Eating and drinking   Eat foods that have a lot of fiber in them. These include whole grains, beans, nuts, fruits, and vegetables.  Ask your doctor about taking products that have added fiber (fibersupplements).  Reduce the amount of fat in your diet. You can do this by: ? Eating low-fat dairy products. ? Eating less red meat. ? Avoiding processed foods.  Drink enough fluid to keep your pee (urine) pale yellow. Managing pain and swelling   Take a warm-water bath (sitz bath) for 20 minutes to ease pain. Do this 3-4 times a day. You may do this in a bathtub or using a portable sitz bath that fits over the toilet.  If told, put ice on the painful area. It may be helpful to use ice between your warm baths. ? Put ice in a plastic bag. ? Place a towel between your skin and the bag. ? Leave the ice on for 20 minutes, 2-3 times a day. General instructions  Take over-the-counter and prescription medicines only as told by your doctor. ? Medicated creams and medicines may be used as told.  Exercise often. Ask your doctor how much and what kind of exercise is best for you.  Go to the bathroom when you have the urge to poop. Do not wait.  Avoid pushing too hard when you poop.  Keep your   butt dry and clean. Use wet toilet paper or moist towelettes after pooping.  Do not sit on the toilet for a long time.  Keep all follow-up visits as told by your doctor. This is important. Contact a doctor if you:  Have pain and swelling that do not get better with treatment or medicine.  Have trouble pooping.  Cannot poop.  Have pain or swelling outside the area of the hemorrhoids. Get help right away if you have:  Bleeding that will not stop. Summary  Hemorrhoids are swollen veins in the butt or around the opening of the butt.   They can cause pain, itching, or bleeding.  Eat foods that have a lot of fiber in them. These include whole grains, beans, nuts, fruits, and vegetables.  Take a warm-water bath (sitz bath) for 20 minutes to ease pain. Do this 3-4 times a day. This information is not intended to replace advice given to you by your health care provider. Make sure you discuss any questions you have with your health care provider. Document Released: 08/16/2008 Document Revised: 11/15/2018 Document Reviewed: 03/29/2018 Elsevier Patient Education  2020 [employer]  

## 2019-11-13 NOTE — Progress Notes (Signed)
Pt here for BP check:128/82, Pt states also has real bad Hemorrhoids.

## 2019-12-10 ENCOUNTER — Telehealth: Payer: Self-pay | Admitting: Advanced Practice Midwife

## 2019-12-10 ENCOUNTER — Encounter: Payer: Self-pay | Admitting: Advanced Practice Midwife

## 2019-12-10 ENCOUNTER — Telehealth (INDEPENDENT_AMBULATORY_CARE_PROVIDER_SITE_OTHER): Payer: Medicaid Other | Admitting: Advanced Practice Midwife

## 2019-12-10 ENCOUNTER — Other Ambulatory Visit: Payer: Self-pay

## 2019-12-10 DIAGNOSIS — Z5329 Procedure and treatment not carried out because of patient's decision for other reasons: Secondary | ICD-10-CM

## 2019-12-10 DIAGNOSIS — Z91199 Patient's noncompliance with other medical treatment and regimen due to unspecified reason: Secondary | ICD-10-CM

## 2019-12-10 NOTE — Progress Notes (Signed)
Called at 237pm no answer advised to call back for another appointment.

## 2019-12-10 NOTE — Progress Notes (Signed)
Called pt for scheduled virtual visit and she did not answer. Message left stating that I will call back in a few minutes.

## 2019-12-10 NOTE — Telephone Encounter (Signed)
Attempted to contact patient to get her rescheduled for her missed postpartum visit. No answer, left voicemail for patient to give the office a call back to be rescheduled. No show letter mailed.  °

## 2020-02-02 IMAGING — US OBSTETRIC <14 WK ULTRASOUND
1 series · 15 of 28 positions shown · non-contrast
Comparison: None relevant

CLINICAL DATA: 33-year-old female with abdominal pain and spotting
in the 1st trimester of pregnancy. Estimated gestational age by LMP
12 weeks 3 days.

EXAM:
OBSTETRIC <14 WK ULTRASOUND
TECHNIQUE: Transabdominal ultrasound was performed for evaluation of the
gestation as well as the maternal uterus and adnexal regions.

[Series 1: obstetric <14 wk ultrasound · 40 acquisitions, 15 frames shown]
[im 1/40]
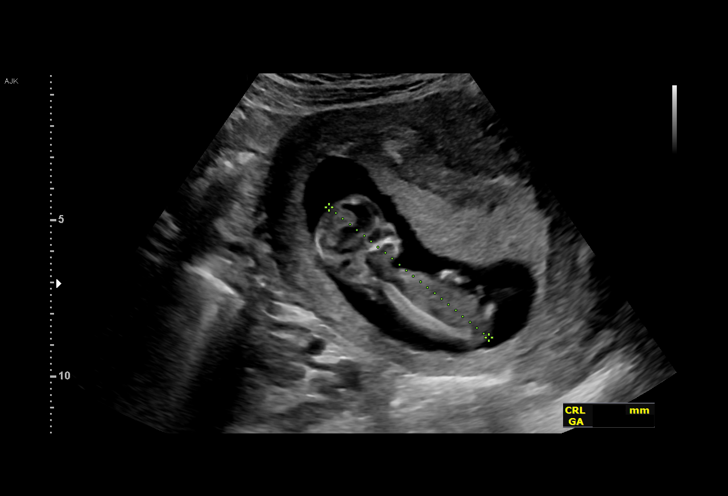
[im 3/40]
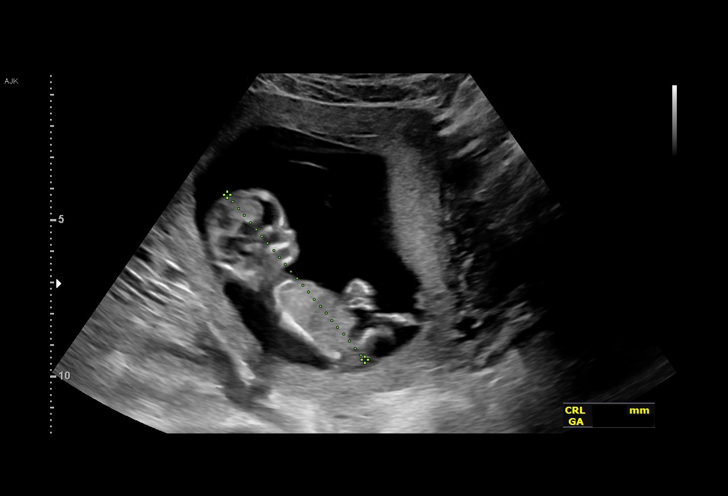
[im 6/40]
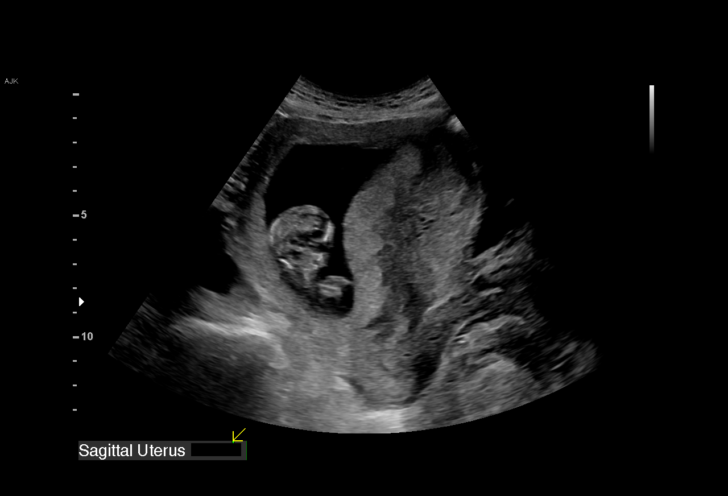
[im 9/40]
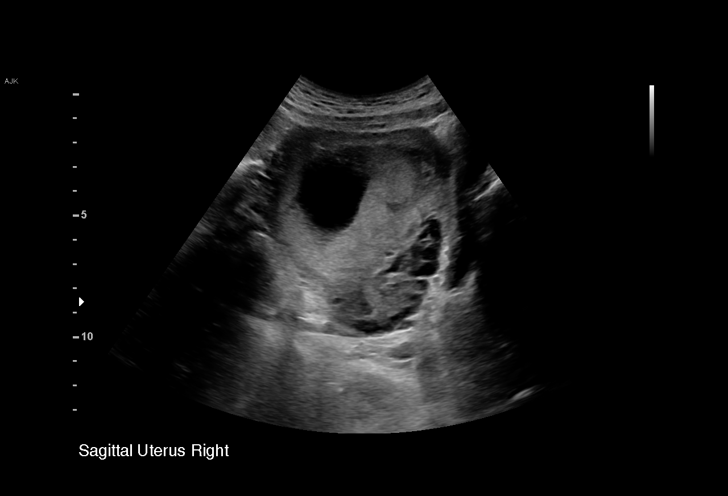
[im 12/40]
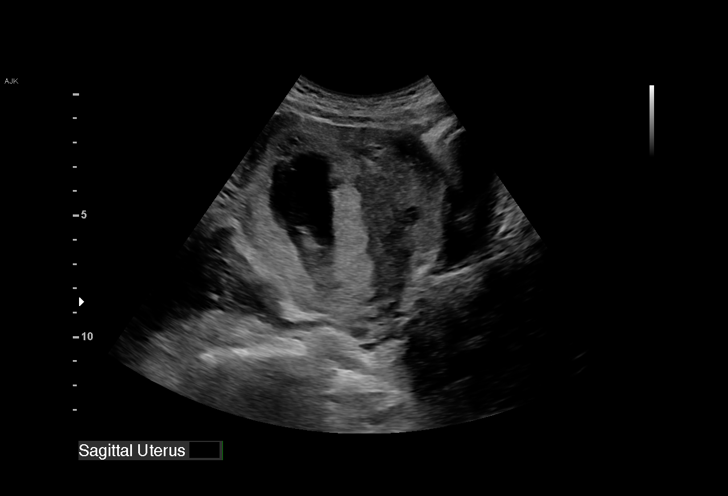
[im 15/40]
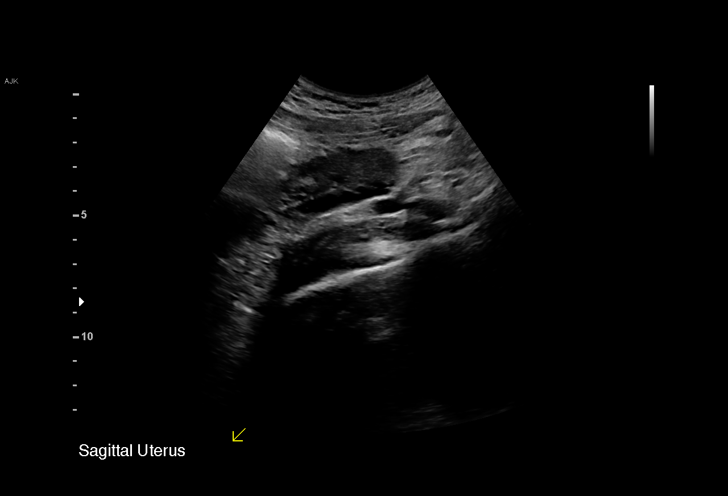
[im 18/40]
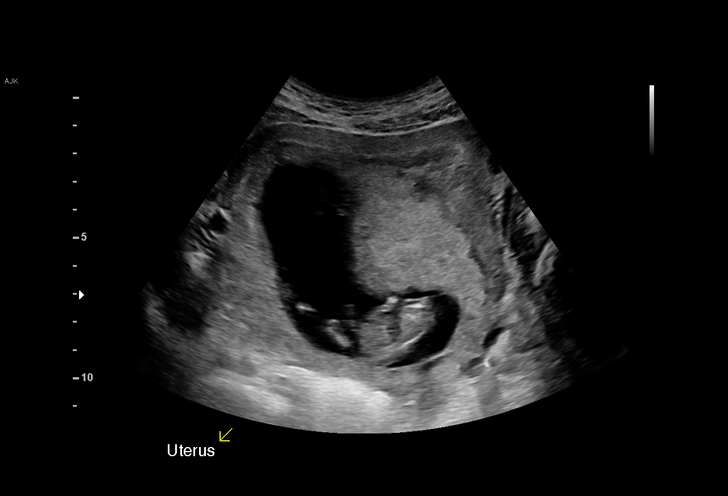
[im 21/40]
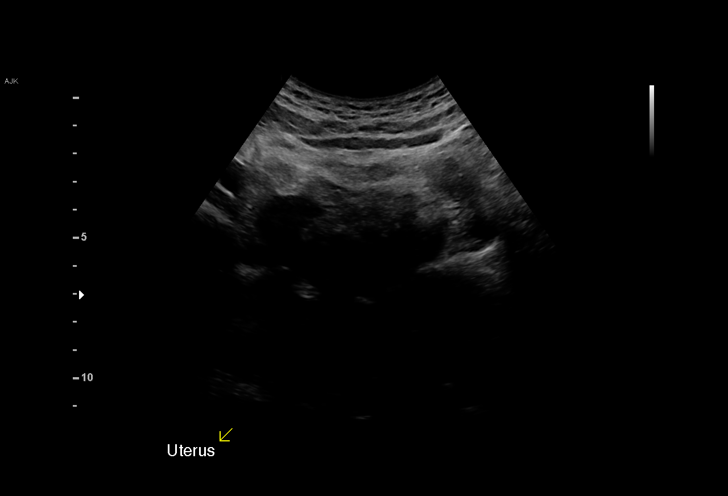
[im 22/40]
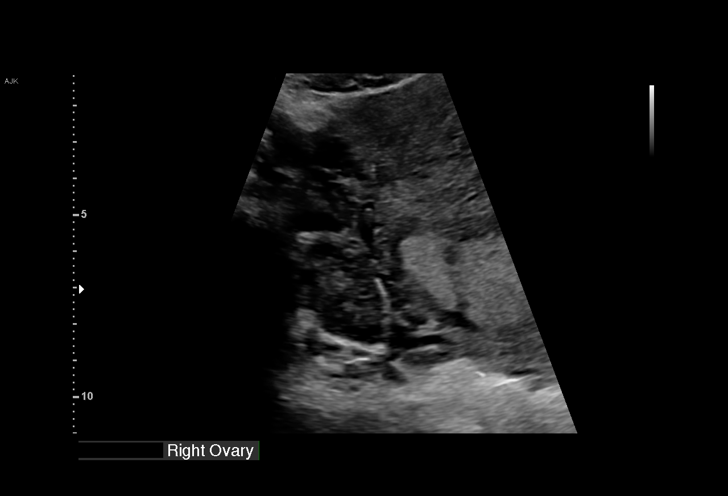
[im 25/40]
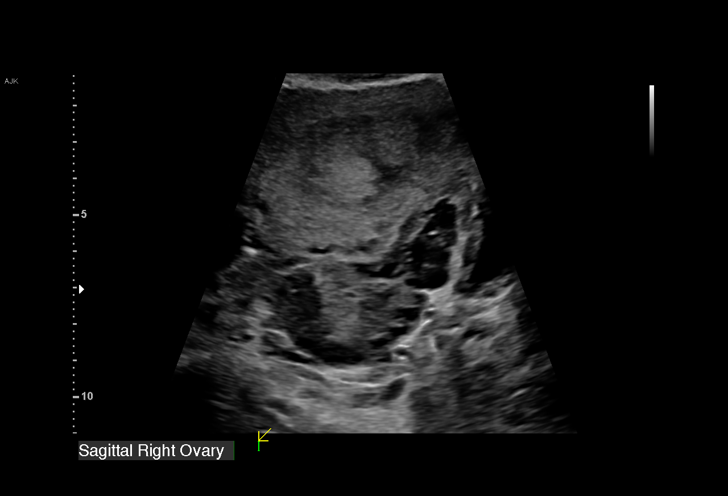
[im 28/40]
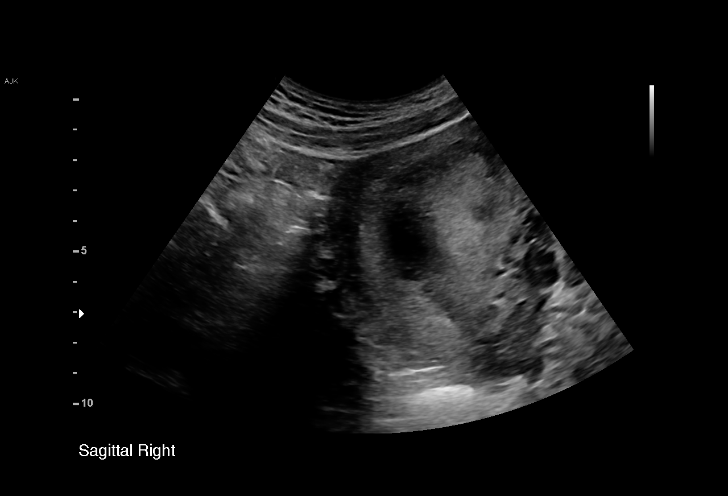
[im 31/40]
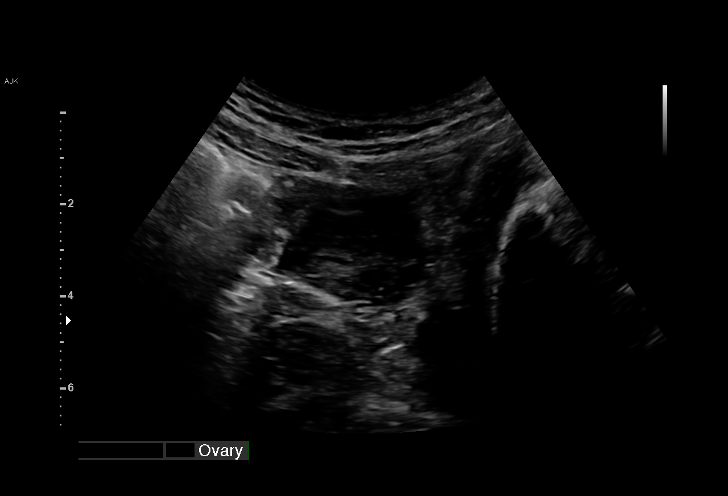
[im 34/40]
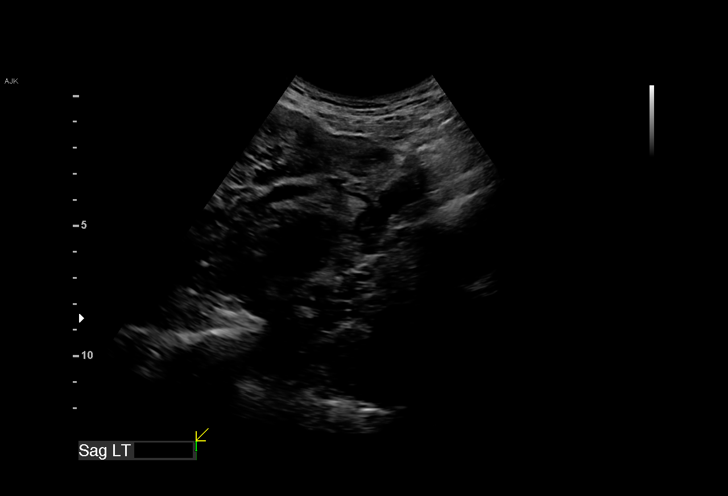
[im 37/40]
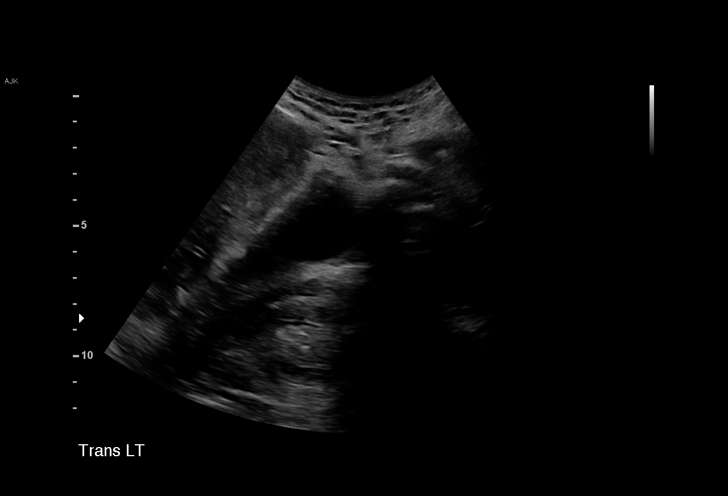
[im 40/40]
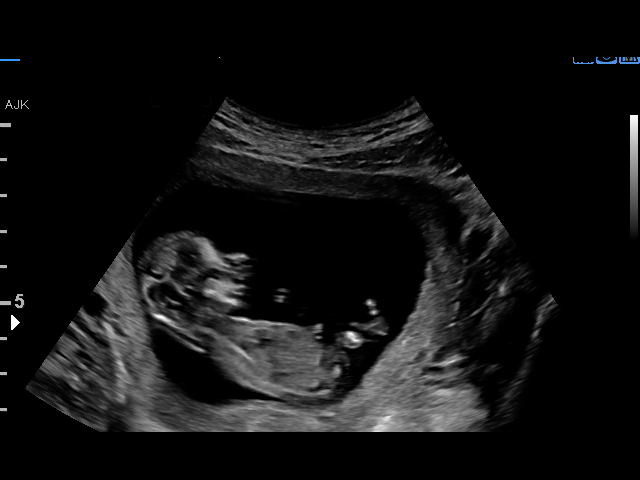

[15 of 28 positions shown; findings below may reference images not displayed]

FINDINGS: Intrauterine gestational sac: Single

Yolk sac:  Not visible

Embryo:  Visible

Cardiac Activity: Detected

Heart Rate: 157 bpm

CRL:   67.2 mm   13 w 0 d                  US EDC: 11/06/2019

Subchorionic hemorrhage:  None visualized.

Maternal uterus/adnexae: Normal right ovary measuring 4.2 x 2.9 x
2.1 centimeters. Normal left ovary measuring 2.2 x 1.1 x
centimeters, containing a corpus luteum. No pelvic free fluid.
IMPRESSION: Single living IUP demonstrated. No acute maternal findings
visualized.

## 2020-04-10 IMAGING — US US MFM OB FOLLOW UP
1 series · 13 of 28 positions shown · non-contrast
Comparison: none

[Series 1: us mfm ob follow up · 13 of 32 slices shown]
[im 2/32]
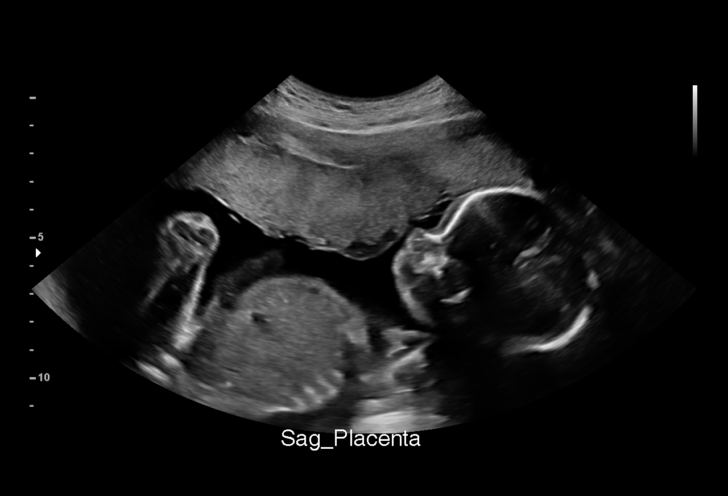
[im 4/32]
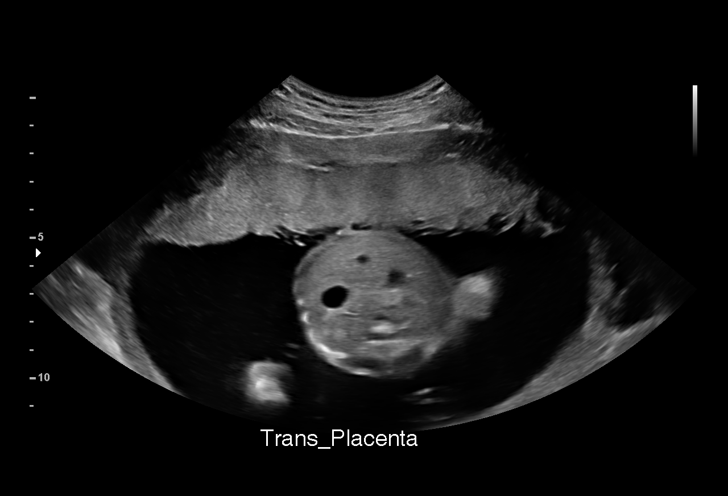
[im 6/32]
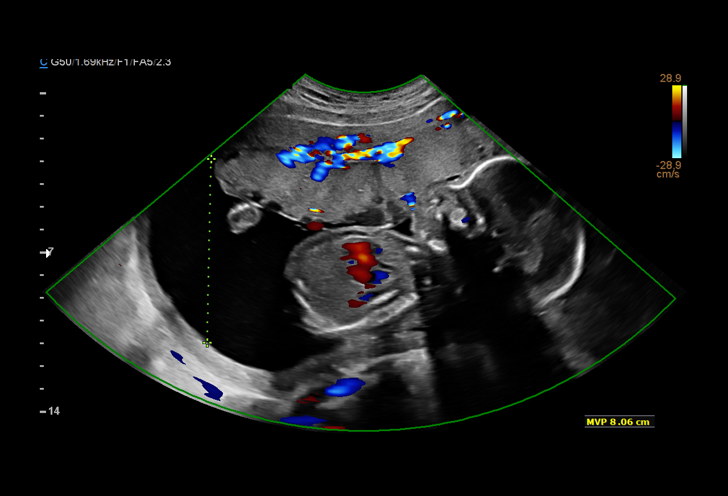
[im 9/32]
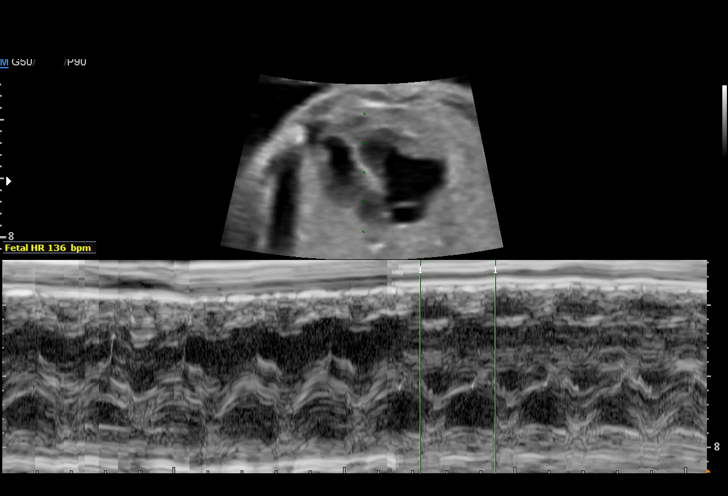
[im 11/32]
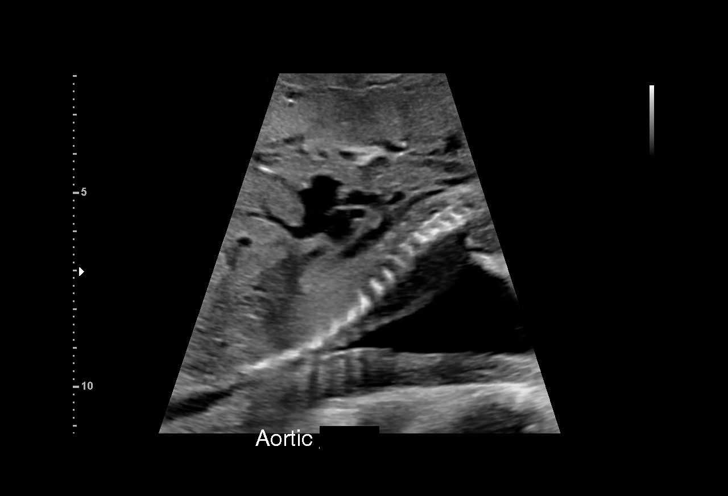
[im 13/32]
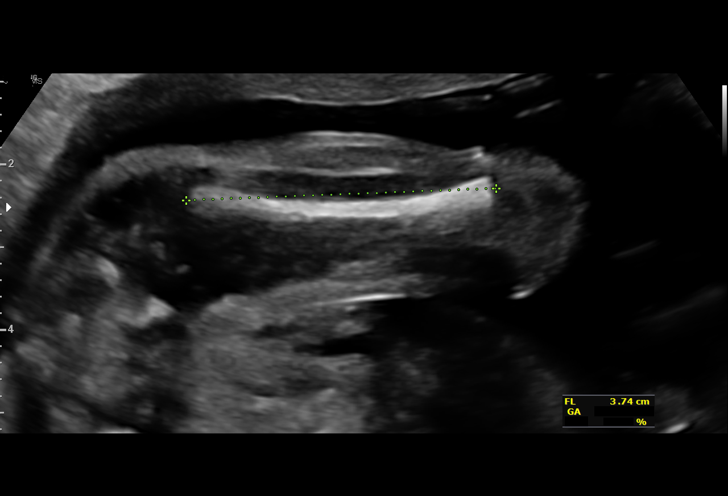
[im 17/32]
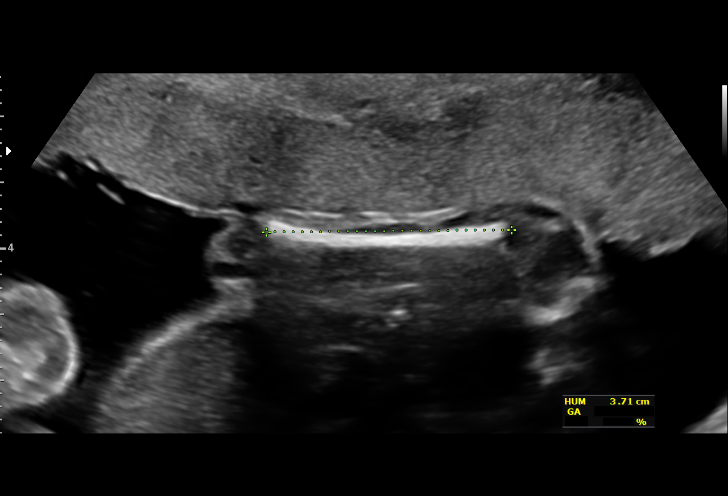
[im 19/32]
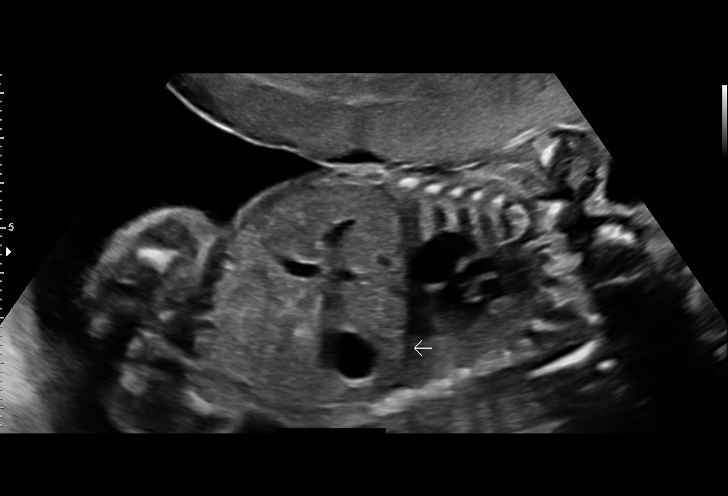
[im 21/32]
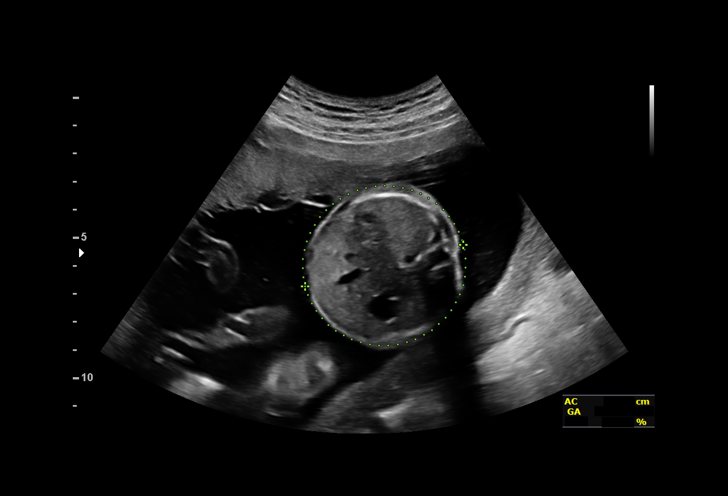
[im 23/32]
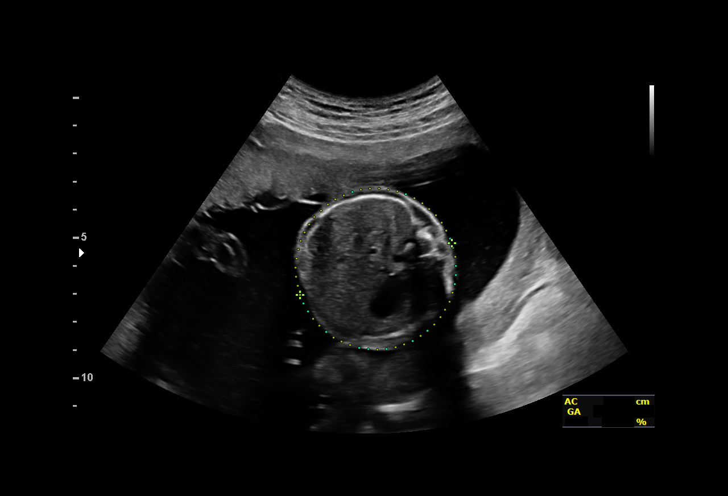
[im 26/32]
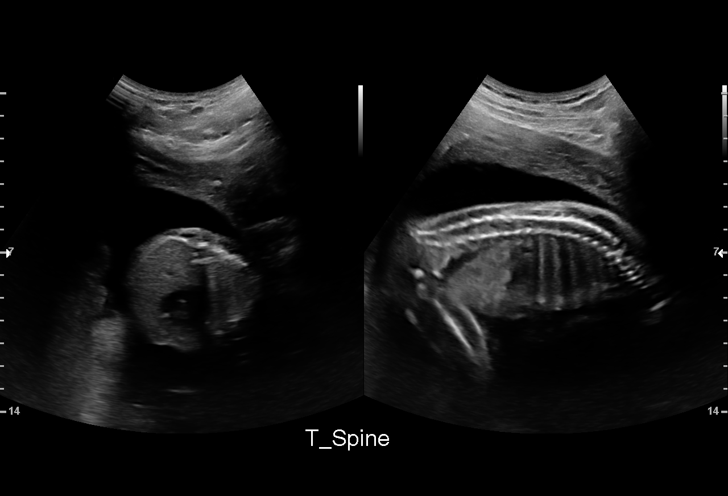
[im 28/32]
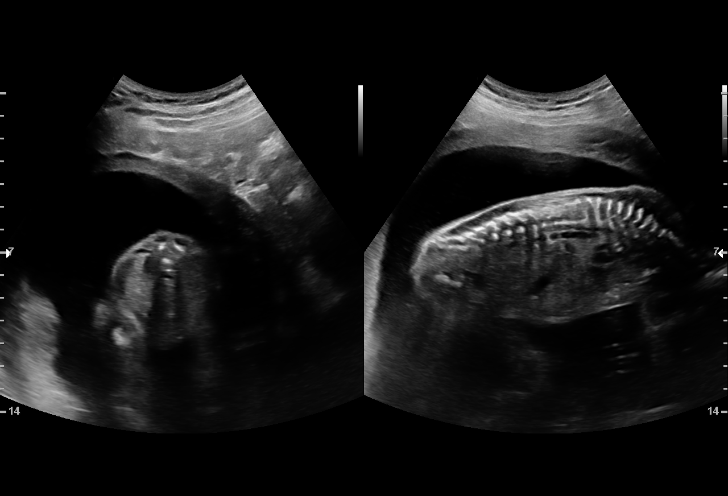
[im 30/32]
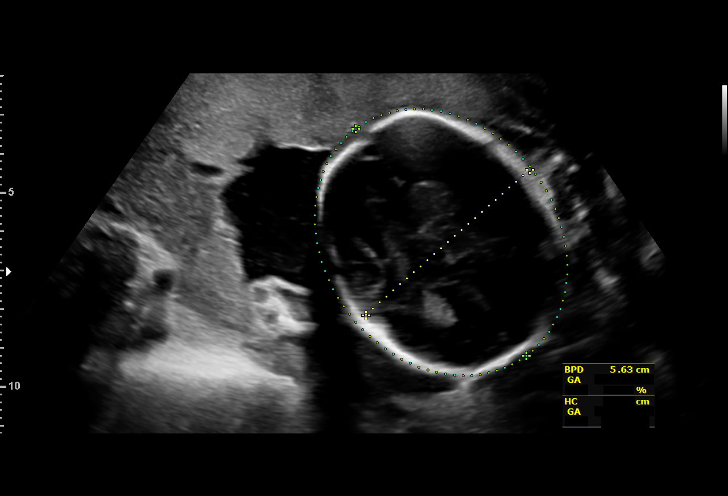

[13 of 28 positions shown; findings below may reference images not displayed]

Name:       CLAUD NAPOLI                    Visit Date: 07/08/2019 [DATE]

                                                            [address]

                                                       [place]
 ----------------------------------------------------------------------

 ----------------------------------------------------------------------
Indications

  Encounter for other antenatal screening
  follow-up
  Advanced maternal age multigravida 34+,
  second trimester
  22 weeks gestation of pregnancy
 ----------------------------------------------------------------------
Vital Signs

 BMI:
Fetal Evaluation

 Num Of Fetuses:         1
 Fetal Heart Rate(bpm):  136
 Cardiac Activity:       Observed
 Presentation:           Cephalic
 Placenta:               Anterior
 P. Cord Insertion:      Previously Visualized

 Amniotic Fluid
 AFI FV:      Within normal limits

                             Largest Pocket(cm)

Biometry

 BPD:      56.3  mm     G. Age:  23w 1d         65  %    CI:        74.65   %    70 - 86
                                                         FL/HC:      18.4   %    19.2 -
 HC:      206.8  mm     G. Age:  22w 5d         39  %    HC/AC:      1.14        1.05 -
 AC:      180.7  mm     G. Age:  22w 6d         48  %    FL/BPD:     67.5   %    71 - 87
 FL:         38  mm     G. Age:  22w 1d         22  %    FL/AC:      21.0   %    20 - 24
 HUM:      37.2  mm     G. Age:  23w 0d         51  %
 Est. FW:     520  gm      1 lb 2 oz     38  %
OB History

 Gravidity:    10        Term:   2         SAB:   1
 TOP:          6        Living:  2
Gestational Age

 LMP:           22w 1d        Date:  02/03/19                 EDD:   11/10/19
 U/S Today:     22w 5d                                        EDD:   11/06/19
 Best:          22w 5d     Det. By:  Early Ultrasound         EDD:   11/06/19
                                     (05/01/19)
Anatomy

 Cranium:               Appears normal         Aortic Arch:            Appears normal
 Cavum:                 Appears normal         Ductal Arch:            Appears normal
 Ventricles:            Appears normal         Diaphragm:              Appears normal
 Choroid Plexus:        Appears normal         Stomach:                Appears normal, left
                                                                       sided
 Cerebellum:            Appears normal         Abdomen:                Appears normal
 Posterior Fossa:       Appears normal         Abdominal Wall:         Appears nml (cord
                                                                       insert, abd wall)
 Nuchal Fold:           Appears normal         Cord Vessels:           Appears normal (3
                                                                       vessel cord)
 Face:                  Appears normal         Kidneys:                Appear normal
                        (orbits and profile)
 Lips:                  Appears normal         Bladder:                Appears normal
 Thoracic:              Appears normal         Spine:                  Limited views
                                                                       appear normal
 Heart:                 Appears normal         Upper Extremities:      Appears normal
                        (4CH, axis, and
                        situs)
 RVOT:                  Appears normal         Lower Extremities:      Appears normal
 LVOT:                  Appears normal

 Other:  Fetus appears to be female. Heels visualized.
Impression

 Patient returned for completion of fetal anatomy. Fetal
 biometry is consistent with her previously-established dates.
 Amniotic fluid is normal and good fetal activity is seen.
Recommendations

 Follow-up scans as clinically indicated.
                 Madenci, Ivaylo

## 2022-06-10 ENCOUNTER — Ambulatory Visit
Admission: EM | Admit: 2022-06-10 | Discharge: 2022-06-10 | Disposition: A | Discharge status: Home / Self Care | Payer: Medicaid Other | Attending: Urgent Care | Admitting: Urgent Care

## 2022-06-10 DIAGNOSIS — B349 Viral infection, unspecified: Secondary | ICD-10-CM | POA: Insufficient documentation

## 2022-06-10 DIAGNOSIS — R52 Pain, unspecified: Secondary | ICD-10-CM

## 2022-06-10 DIAGNOSIS — R0981 Nasal congestion: Secondary | ICD-10-CM | POA: Diagnosis not present

## 2022-06-10 DIAGNOSIS — R070 Pain in throat: Secondary | ICD-10-CM | POA: Insufficient documentation

## 2022-06-10 DIAGNOSIS — J069 Acute upper respiratory infection, unspecified: Secondary | ICD-10-CM | POA: Diagnosis not present

## 2022-06-10 DIAGNOSIS — Z7251 High risk heterosexual behavior: Secondary | ICD-10-CM | POA: Diagnosis not present

## 2022-06-10 DIAGNOSIS — Z113 Encounter for screening for infections with a predominantly sexual mode of transmission: Secondary | ICD-10-CM | POA: Insufficient documentation

## 2022-06-10 LAB — POCT RAPID STREP A (OFFICE): Rapid Strep A Screen: NEGATIVE

## 2022-06-10 MED ORDER — ACETAMINOPHEN 325 MG PO TABS: 650.0000 mg | ORAL_TABLET | Freq: Four times a day (QID) | ORAL | 0 refills | Status: AC | PRN

## 2022-06-10 MED ORDER — PSEUDOEPHEDRINE HCL 60 MG PO TABS: 60.0000 mg | ORAL_TABLET | Freq: Three times a day (TID) | ORAL | 0 refills | Status: AC | PRN

## 2022-06-10 MED ORDER — LEVOCETIRIZINE DIHYDROCHLORIDE 5 MG PO TABS: 5.0000 mg | ORAL_TABLET | Freq: Every evening | ORAL | 0 refills | Status: AC

## 2022-06-10 NOTE — Discharge Instructions (Addendum)
We will manage this as a viral illness. For sore throat or cough try using a honey-based tea. Use 3 teaspoons of honey with juice squeezed from half lemon. Place shaved pieces of ginger into 1/2-1 cup of water and warm over stove top. Then mix the ingredients and repeat every 4 hours as needed. Please take ibuprofen 600mg  every 6 hours with food alternating with OR taken together with Tylenol 500mg -650mg  every 6 hours for throat pain, fevers, aches and pains. Hydrate very well with at least 2 liters of water. Eat light meals such as soups (chicken and noodles, vegetable, chicken and wild rice).  Do not eat foods that you are allergic to.  Taking an antihistamine like Xyzal can help against postnasal drainage, sinus congestion which can cause sinus pain, sinus headaches, throat pain, painful swallowing, coughing.  You can take this together with pseudoephedrine (Sudafed) at a dose of 30-60 mg 3 times a day or twice daily as needed for the same kind of nasal drip, congestion.

## 2022-06-10 NOTE — ED Provider Notes (Signed)
[facility]   MRN: [id number] DOB: August 24, 1986  Subjective:   Maria Bowman is a 36 y.o. female presenting for 1 day history of acute onset body pains, throat pain, congestion, postnasal drainage, malaise and fatigue.  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  Has used NyQuil and Advil with minimal relief.  No overt chest pain, shortness of breath or wheezing.  No smoking, drug use, alcohol use.  Patient is otherwise healthy to the best of her knowledge.  Of note, patient did have unprotected oral sex several days ago.  Would like to make sure its not an STI of the throat.  No current facility-administered medications for this encounter.  Current Outpatient Medications:    acetaminophen (TYLENOL) 325 MG tablet, Take 2 tablets (650 mg total) by mouth every 6 (six) hours as needed., Disp: 30 tablet, Rfl: 0   hydrocortisone-pramoxine (PROCTOFOAM HC) rectal foam, Place 1 applicator rectally 2 (two) times daily., Disp: 10 g, Rfl: 1   ibuprofen (ADVIL) 600 MG tablet, Take 1 tablet (600 mg total) by mouth every 6 (six) hours as needed., Disp: 30 tablet, Rfl: 0   Prenatal Vit-Fe Fumarate-FA (PREPLUS) 27-1 MG TABS, Take 1 tablet by mouth daily., Disp: 30 tablet, Rfl: 13   senna-docusate (SENOKOT-S) 8.6-50 MG tablet, Take 2 tablets by mouth at bedtime as needed for mild constipation., Disp: 30 tablet, Rfl: 0   witch hazel-glycerin (TUCKS) pad, Apply 1 application topically as needed for itching., Disp: 40 each, Rfl: 12   No Known Allergies  Past Medical History:  Diagnosis Date   Anemia    Fractured tooth    upper front tooth fractured   Hemorrhage after delivery of fetus 2003   SVD (spontaneous vaginal delivery)    x 2     Past Surgical History:  Procedure Laterality Date   DILATION AND EVACUATION N/A 03/27/2018   Procedure: DILATATION AND EVACUATION;  Surgeon: Tereso Newcomer, MD;  Location:  [facility];  Service: Gynecology;  Laterality: N/A;  Ultrasound guided   INDUCED ABORTION     has had 6 elective abortions   THERAPEUTIC ABORTION      Family History  Problem Relation Age of Onset   Stroke Mother    Heart disease Maternal Grandfather     Social History   Tobacco Use   Smoking status: Never   Smokeless tobacco: Never  Vaping Use   Vaping Use: Never used  Substance Use Topics   Alcohol use: Not Currently    Comment: socially   Drug use: Not Currently    Types: Marijuana    Comment: Last use June 2020    ROS   Objective:   Vitals: BP 124/90 (BP Location: Left Arm)   Pulse (!) 103   Temp 100.3 F (37.9 C) (Oral)   Resp 18   LMP 05/23/2022 (Approximate)   SpO2 97%   Physical Exam Constitutional:      General: She is not in acute distress.    Appearance: Normal appearance. She is well-developed and normal weight. She is ill-appearing. She is not toxic-appearing or diaphoretic.  HENT:     Head: Normocephalic and atraumatic.     Right Ear: Tympanic membrane, ear canal and external ear normal. No drainage or tenderness. No middle ear effusion. There is no impacted cerumen. Tympanic membrane is not erythematous.     Left Ear: Tympanic  membrane, ear canal and external ear normal. No drainage or tenderness.  No middle ear effusion. There is no impacted cerumen. Tympanic membrane is not erythematous.     Nose: Nose normal. No congestion or rhinorrhea.     Mouth/Throat:     Mouth: Mucous membranes are moist. No oral lesions.     Pharynx: No pharyngeal swelling, oropharyngeal exudate, posterior oropharyngeal erythema or uvula swelling.     Tonsils: No tonsillar exudate or tonsillar abscesses. 0 on the right. 0 on the left.  Eyes:     General: No scleral icterus.       Right eye: No discharge.        Left eye: No discharge.     Extraocular Movements: Extraocular movements intact.     Right eye: Normal extraocular motion.     Left eye: Normal extraocular  motion.     Conjunctiva/sclera: Conjunctivae normal.  Cardiovascular:     Rate and Rhythm: Normal rate and regular rhythm.     Heart sounds: Normal heart sounds. No murmur heard.    No friction rub. No gallop.  Pulmonary:     Effort: Pulmonary effort is normal. No respiratory distress.     Breath sounds: No stridor. No wheezing, rhonchi or rales.  Chest:     Chest wall: No tenderness.  Musculoskeletal:     Cervical back: Normal range of motion and neck supple.  Lymphadenopathy:     Cervical: No cervical adenopathy.  Skin:    General: Skin is warm and dry.  Neurological:     General: No focal deficit present.     Mental Status: She is alert and oriented to person, place, and time.  Psychiatric:        Mood and Affect: Mood normal.        Behavior: Behavior normal.     Results for orders placed or performed during the hospital encounter of 06/10/22 (from the past 24 hour(s))  POCT rapid strep A     Status: None   Collection Time: 06/10/22 10:08 AM  Result Value Ref Range   Rapid Strep A Screen Negative Negative    Assessment and Plan :   PDMP not reviewed this encounter.  1. Acute viral syndrome   2. Throat pain   3. Body aches   4. Sinus congestion   5. Unprotected sex    Strep culture, COVID and flu test pending.  Oral STI check pending.  We will otherwise manage for viral upper respiratory infection.  Physical exam findings reassuring and vital signs stable for discharge. Advised supportive care, offered symptomatic relief. Deferred imaging given clear cardiopulmonary exam, hemodynamically stable vital signs. Counseled patient on potential for adverse effects with medications prescribed/recommended today, ER and return-to-clinic precautions discussed, patient verbalized understanding.      Wallis Bamberg, PA-C 06/10/22 1027

## 2022-06-10 NOTE — ED Triage Notes (Signed)
The pt c/o body aches, sore throat, congestion,  Started: yesterday Home interventions: nyquil, advil

## 2022-06-11 LAB — COVID-19, FLU A+B NAA
Influenza A, NAA: NOT DETECTED
Influenza B, NAA: NOT DETECTED
SARS-CoV-2, NAA: NOT DETECTED

## 2022-06-11 LAB — CULTURE, GROUP A STREP (THRC)

## 2022-06-13 ENCOUNTER — Telehealth (HOSPITAL_COMMUNITY): Payer: Self-pay | Admitting: Emergency Medicine

## 2022-06-13 MED ORDER — AMOXICILLIN 500 MG PO CAPS: 500.0000 mg | ORAL_CAPSULE | Freq: Two times a day (BID) | ORAL | 0 refills | Status: AC

## 2022-06-14 ENCOUNTER — Telehealth (HOSPITAL_COMMUNITY): Payer: Self-pay | Admitting: Emergency Medicine

## 2022-06-14 ENCOUNTER — Ambulatory Visit
Admission: RE | Admit: 2022-06-14 | Discharge: 2022-06-14 | Disposition: A | Discharge status: Home / Self Care | Payer: Medicaid Other | Source: Ambulatory Visit | Attending: Emergency Medicine | Admitting: Emergency Medicine

## 2022-06-14 DIAGNOSIS — A545 Gonococcal pharyngitis: Secondary | ICD-10-CM | POA: Diagnosis not present

## 2022-06-14 LAB — CYTOLOGY, (ORAL, ANAL, URETHRAL) ANCILLARY ONLY
Chlamydia: NEGATIVE
Comment: NEGATIVE
Comment: NEGATIVE
Comment: NORMAL
Neisseria Gonorrhea: POSITIVE — AB
Trichomonas: NEGATIVE

## 2022-06-14 MED ORDER — CEFTRIAXONE SODIUM 500 MG IJ SOLR
500.0000 mg | Freq: Once | INTRAMUSCULAR | Status: AC
  Administered 2022-06-14: 500 mg via INTRAMUSCULAR

## 2022-06-14 NOTE — Discharge Instructions (Signed)
You received a shot of rocephin today. Please do not have sex or use tampons for 7 days. If symptoms persist please come back.

## 2022-06-14 NOTE — Telephone Encounter (Signed)
Per protocol, patient will need treatment IM Rocephin 500mg  for positive Gonorrhea of the throat.   Contacted patient by phone.  Verified identity using two identifiers.  Provided positive result.  Reviewed safe sex practices, notifying partners, and refraining from sexual activities for 7 days from time of treatment.  Patient verified understanding, all questions answered.   [facility] notified

## 2022-06-14 NOTE — ED Triage Notes (Signed)
The patient presents to urgent care for treatment.

## 2022-06-28 ENCOUNTER — Ambulatory Visit
Admission: RE | Admit: 2022-06-28 | Discharge: 2022-06-28 | Disposition: A | Discharge status: Home / Self Care | Payer: Medicaid Other | Source: Ambulatory Visit | Attending: Family Medicine | Admitting: Family Medicine

## 2022-06-28 VITALS — BP 102/69 | HR 98 | Temp 100.8°F | Resp 18

## 2022-06-28 DIAGNOSIS — R509 Fever, unspecified: Secondary | ICD-10-CM | POA: Insufficient documentation

## 2022-06-28 LAB — POCT RAPID STREP A (OFFICE): Rapid Strep A Screen: NEGATIVE

## 2022-06-28 MED ORDER — ACETAMINOPHEN 325 MG PO TABS
650.0000 mg | ORAL_TABLET | Freq: Once | ORAL | Status: AC
  Administered 2022-06-28: 650 mg via ORAL

## 2022-06-28 MED ORDER — IBUPROFEN 800 MG PO TABS
800.0000 mg | ORAL_TABLET | Freq: Once | ORAL | Status: AC
  Administered 2022-06-28: 800 mg via ORAL

## 2022-06-28 NOTE — ED Provider Notes (Signed)
[facility]    CSN: [id number] Arrival date & time: 06/28/22  1712      History   Chief Complaint Chief Complaint  Patient presents with   Generalized Body Aches   Chills    HPI Maria Bowman is a 36 y.o. female.   HPI Here with fever and myalgia and malaise since this morning.  She has had some mild congestion but not much cough.  No sore throat now.  No nausea or vomiting or diarrhea.  In late July she was here with fever and sore throat.  At that time she tested positive for gonorrhea and treatment made her feel better.  Now she does not have sore throat, but she is concerned that she might still have gonorrhea.  Of note her daughter had fever that resolved on its own about 3 days ago.  She had some mild nasal congestion at that time also.  Last menstrual cycle was about 1 week ago Past Medical History:  Diagnosis Date   Anemia    Fractured tooth    upper front tooth fractured   Hemorrhage after delivery of fetus 2003   SVD (spontaneous vaginal delivery)    x 2    Patient Active Problem List   Diagnosis Date Noted   SVD (spontaneous vaginal delivery) 11/10/2019   Rh negative status during pregnancy 11/10/2019   Full-term premature rupture of membranes 11/09/2019   History of suicide attempt 10/22/2019   Group B Streptococcus carrier state affecting pregnancy 10/10/2019   History of molar pregnancy 05/20/2019   Anemia affecting pregnancy 05/20/2019   Supervision of other normal pregnancy, antepartum 05/30/2019   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 Authorizing Provider  acetaminophen (TYLENOL) 325 MG tablet Take 2 tablets (650 mg total) by mouth every 6 (six) hours as needed for moderate pain. 06/10/22   Wallis Bamberg, PA-C  hydrocortisone-pramoxine (PROCTOFOAM [facility]) rectal foam Place 1 applicator rectally 2 (two) times daily. 11/13/19   Marny Lowenstein, PA-C  ibuprofen (ADVIL) 600 MG tablet Take 1 tablet (600 mg total) by mouth every 6 (six) hours as needed. 11/11/19   Lazaro Arms, MD  levocetirizine (XYZAL) 5 MG tablet Take 1 tablet (5 mg total) by mouth every evening. 06/10/22   Wallis Bamberg, PA-C  Prenatal Vit-Fe Fumarate-FA (PREPLUS) 27-1 MG TABS Take 1 tablet by mouth daily. 05/01/19   Calvert Cantor, CNM  pseudoephedrine (SUDAFED) 60 MG tablet Take 1 tablet (60 mg total) by mouth every 8 (eight) hours as needed for congestion. 06/10/22   Wallis Bamberg, PA-C  senna-docusate (SENOKOT-S)  8.6-50 MG tablet Take 2 tablets by mouth at bedtime as needed for mild constipation. 11/11/19   Lazaro Arms, MD  witch hazel-glycerin (TUCKS) pad Apply 1 application topically as needed for itching. 11/13/19   Marny Lowenstein, PA-C    Family History Family History  Problem Relation Age of Onset   Stroke Mother    Heart disease Maternal Grandfather     Social History Social History   Tobacco Use   Smoking status: Never   Smokeless tobacco: Never  Vaping Use   Vaping Use: Never used  Substance Use Topics   Alcohol use: Not Currently    Comment: socially   Drug use: Not Currently    Types: Marijuana    Comment: Last use June 2020     Allergies   Patient has no known allergies.   Review of Systems Review of Systems   Physical Exam Triage Vital Signs ED Triage Vitals  Enc Vitals Group     BP 06/28/22 1736 102/69      Pulse Rate 06/28/22 1736 98     Resp 06/28/22 1736 18     Temp 06/28/22 1736 (!) 100.8 F (38.2 C)     Temp Source 06/28/22 1736 Oral     SpO2 06/28/22 1736 99 %     Weight --      Height --      Head Circumference --      Peak Flow --      Pain Score 06/28/22 1735 10     Pain Loc --      Pain Edu? --      Excl. in [place]? --    No data found.  Updated Vital Signs BP 102/69 (BP Location: Right Arm)   Pulse 98   Temp (!) 100.8 F (38.2 C) (Oral)   Resp 18   LMP 06/23/2022 (Approximate)   SpO2 99%   Visual Acuity Right Eye Distance:   Left Eye Distance:   Bilateral Distance:    Right Eye Near:   Left Eye Near:    Bilateral Near:     Physical Exam Vitals reviewed.  Constitutional:      General: She is not in acute distress.    Appearance: She is not ill-appearing, toxic-appearing or diaphoretic.  HENT:     Right Ear: Tympanic membrane normal.     Left Ear: Tympanic membrane normal.     Nose: Nose normal.     Mouth/Throat:     Mouth: Mucous membranes are moist.     Comments: There is some mild erythema and clear mucus in the oropharynx. Eyes:     Extraocular Movements: Extraocular movements intact.     Conjunctiva/sclera: Conjunctivae normal.     Pupils: Pupils are equal, round, and reactive to light.  Cardiovascular:     Rate and Rhythm: Normal rate and regular rhythm.     Heart sounds: No murmur heard. Pulmonary:     Effort: Pulmonary effort is normal. No respiratory distress.     Breath sounds: Normal breath sounds. No stridor. No wheezing, rhonchi or rales.  Musculoskeletal:     Cervical back: Neck supple.  Lymphadenopathy:     Cervical: No cervical adenopathy.  Skin:    Coloration: Skin is not jaundiced or pale.  Neurological:     General: No focal deficit present.     Mental Status: She is alert and oriented to person, place, and time.  Psychiatric:        Behavior: Behavior  normal.      UC Treatments / Results  Labs (all labs ordered are  listed, but only abnormal results are displayed) Labs Reviewed  COVID-19, FLU A+B NAA  CULTURE, GROUP A STREP [facility])  POCT RAPID STREP A (OFFICE)  CYTOLOGY, (ORAL, ANAL, URETHRAL) ANCILLARY ONLY    EKG   Radiology No results found.  Procedures Procedures (including critical care time)  Medications Ordered in UC Medications  ibuprofen (ADVIL) tablet 800 mg (has no administration in time range)  acetaminophen (TYLENOL) tablet 650 mg (650 mg Oral Given 06/28/22 1746)    Initial Impression / Assessment and Plan / UC Course  I have reviewed the triage vital signs and the nursing notes.  Pertinent labs & imaging results that were available during my care of the patient were reviewed by me and considered in my medical decision making (see chart for details).     Rapid strep is negative, so we will do a culture and treat per protocol if positive  Cytology swab was done of the oropharynx to ensure this is not gonorrhea again.  COVID and flu swab is done.  If she is positive for flu, she should have Tamiflu Final Clinical Impressions(s) / UC Diagnoses   Final diagnoses:  Fever, unspecified     Discharge Instructions      Your strep test is negative.  Culture of the throat will be sent, and staff will notify you if that is in turn positive.   You have been swabbed for COVID and flu, and the test will result in the next 24 hours. Our staff will call you if positive. If the test is positive, you should quarantine for 5 days from the start of your symptoms  Staff will call you if there is anything positive on the other throat swab we did.  Tylenol or ibuprofen as needed for fever and aches.  Make sure you drink enough fluids      ED Prescriptions   None    PDMP not reviewed this encounter.   Zenia Resides, MD 06/28/22 [id number]

## 2022-06-28 NOTE — ED Triage Notes (Signed)
The patient states this morning she began have generalized body aches and chills, she states she just does not feel good.   Home interventions: NyQuil today around 1200pm

## 2022-06-28 NOTE — Discharge Instructions (Addendum)
Your strep test is negative.  Culture of the throat will be sent, and staff will notify you if that is in turn positive.   You have been swabbed for COVID and flu, and the test will result in the next 24 hours. Our staff will call you if positive. If the test is positive, you should quarantine for 5 days from the start of your symptoms  Staff will call you if there is anything positive on the other throat swab we did.  Tylenol or ibuprofen as needed for fever and aches.  Make sure you drink enough fluids

## 2022-06-29 LAB — CYTOLOGY, (ORAL, ANAL, URETHRAL) ANCILLARY ONLY
Chlamydia: NEGATIVE
Comment: NEGATIVE
Comment: NEGATIVE
Comment: NORMAL
Neisseria Gonorrhea: NEGATIVE
Trichomonas: NEGATIVE

## 2022-06-29 LAB — COVID-19, FLU A+B NAA
Influenza A, NAA: NOT DETECTED
Influenza B, NAA: NOT DETECTED
SARS-CoV-2, NAA: DETECTED — AB

## 2022-07-01 LAB — CULTURE, GROUP A STREP (THRC)

## 2023-01-05 ENCOUNTER — Ambulatory Visit
Admission: EM | Admit: 2023-01-05 | Discharge: 2023-01-05 | Disposition: A | Discharge status: Home / Self Care | Payer: Medicaid Other | Attending: Nurse Practitioner | Admitting: Nurse Practitioner

## 2023-01-05 DIAGNOSIS — J0110 Acute frontal sinusitis, unspecified: Secondary | ICD-10-CM | POA: Diagnosis not present

## 2023-01-05 MED ORDER — FLUTICASONE PROPIONATE 50 MCG/ACT NA SUSP: 1.0000 | Freq: Every day | NASAL | 0 refills | Status: AC

## 2023-01-05 MED ORDER — AMOXICILLIN-POT CLAVULANATE 875-125 MG PO TABS: 1.0000 | ORAL_TABLET | Freq: Two times a day (BID) | ORAL | 0 refills | Status: AC

## 2023-01-05 NOTE — Discharge Instructions (Signed)
Augmentin twice daily for 10 days Flonase daily Nasal rinses as tolerated Rest and fluids Follow-up with your PCP 2 to 3 days for recheck Please go to the emergency room if you have any worsening symptoms

## 2023-01-05 NOTE — ED Triage Notes (Signed)
Pt states that she has a cough, chest congestion, headache and nose bleeds. X1 week

## 2023-01-05 NOTE — ED Provider Notes (Signed)
[facility]    CSN: [id number] Arrival date & time: 01/05/23  1049      History   Chief Complaint Chief Complaint  Patient presents with   Cough    X1 week Cough, chest congestion, headache and nose bleeds.    HPI Maria Bowman is a 37 y.o. female  presents for evaluation of URI symptoms for 2 weeks. Patient reports associated symptoms of sinus pressure and pain with purulent nasal discharge, headache, postnasal drip with cough, and today and nosebleed. Denies N/V/D, fevers, ear pain, sore throat, shortness of breath. Patient does not have a hx of asthma or smoking. No known sick contacts.  Pt has taken Tylenol OTC for symptoms. Pt has no other concerns at this time.    Cough Associated symptoms: headaches     Past Medical History:  Diagnosis Date   Anemia    Fractured tooth    upper front tooth fractured   Hemorrhage after delivery of fetus 2003   SVD (spontaneous vaginal delivery)    x 2    Patient Active Problem List   Diagnosis Date Noted   SVD (spontaneous vaginal delivery) 11/10/2019   Rh negative status during pregnancy 11/10/2019   Full-term premature rupture of membranes 11/09/2019   History of suicide attempt 10/22/2019   Group B Streptococcus carrier state affecting pregnancy 10/10/2019   History of molar pregnancy 05/20/2019   Anemia affecting pregnancy 05/20/2019   Supervision of other normal pregnancy, antepartum 06/01/19   Anti-D antibodies present during pregnancy 06/01/19    Past Surgical History:  Procedure Laterality Date   DILATION AND EVACUATION N/A 03/27/2018   Procedure: DILATATION AND EVACUATION;  Surgeon: Osborne Oman, MD;  Location: [facility];  Service: Gynecology;  Laterality: N/A;  Ultrasound guided   INDUCED ABORTION     has had 6 elective abortions   THERAPEUTIC ABORTION      OB History     Gravida  10   Para  3   Term  3   Preterm  0   AB  7   Living  3      SAB  0   IAB  6   Ectopic  0    Multiple  0   Live Births  3            Home Medications    Prior to Admission medications   Medication Sig Start Date End Date Taking? Authorizing Provider  acetaminophen (TYLENOL) 325 MG tablet Take 2 tablets (650 mg total) by mouth every 6 (six) hours as needed for moderate pain. 06/10/22  Yes Jaynee Eagles, PA-C  amoxicillin-clavulanate (AUGMENTIN) 875-125 MG tablet Take 1 tablet by mouth every 12 (twelve) hours for 10 days. 01/05/23 01/15/23 Yes Melynda Ripple, NP  fluticasone (FLONASE) 50 MCG/ACT nasal spray Place 1 spray into both nostrils daily. 01/05/23  Yes Melynda Ripple, NP  hydrocortisone-pramoxine (PROCTOFOAM [facility]) rectal foam Place 1 applicator rectally 2 (two) times daily. 11/13/19   Luvenia Redden, PA-C  ibuprofen (ADVIL) 600 MG tablet Take 1 tablet (600 mg total) by mouth every 6 (six) hours as needed. 11/11/19   Florian Buff, MD  levocetirizine (XYZAL) 5 MG tablet Take 1 tablet (5 mg total) by mouth every evening. 06/10/22   Jaynee Eagles, PA-C  Prenatal Vit-Fe Fumarate-FA (PREPLUS) 27-1 MG TABS Take 1 tablet by mouth daily. 05/01/19   Darlina Rumpf, CNM  pseudoephedrine (SUDAFED) 60 MG tablet Take 1 tablet (60 mg total)  by mouth every 8 (eight) hours as needed for congestion. 06/10/22   Jaynee Eagles, PA-C  senna-docusate (SENOKOT-S) 8.6-50 MG tablet Take 2 tablets by mouth at bedtime as needed for mild constipation. 11/11/19   Florian Buff, MD  witch hazel-glycerin (TUCKS) pad Apply 1 application topically as needed for itching. 11/13/19   Luvenia Redden, PA-C    Family History Family History  Problem Relation Age of Onset   Stroke Mother    Heart disease Maternal Grandfather     Social History Social History   Tobacco Use   Smoking status: Never   Smokeless tobacco: Never  Vaping Use   Vaping Use: Never used  Substance Use Topics   Alcohol use: Not Currently    Comment: socially   Drug use: Not Currently    Types: Marijuana    Comment: Last use June  2020     Allergies   Patient has no known allergies.   Review of Systems Review of Systems  HENT:  Positive for congestion, postnasal drip, sinus pressure and sinus pain.   Respiratory:  Positive for cough.   Neurological:  Positive for headaches.     Physical Exam Triage Vital Signs ED Triage Vitals  Enc Vitals Group     BP 01/05/23 1114 115/76     Pulse Rate 01/05/23 1114 64     Resp 01/05/23 1114 18     Temp 01/05/23 1114 99.2 F (37.3 C)     Temp Source 01/05/23 1114 Oral     SpO2 01/05/23 1114 98 %     Weight 01/05/23 1109 156 lb (70.8 kg)     Height 01/05/23 1109 5' 1.5" (1.562 m)     Head Circumference --      Peak Flow --      Pain Score 01/05/23 1109 2     Pain Loc --      Pain Edu? --      Excl. in [place]? --    No data found.  Updated Vital Signs BP 115/76 (BP Location: Left Arm)   Pulse 64   Temp 99.2 F (37.3 C) (Oral)   Resp 18   Ht 5' 1.5" (1.562 m)   Wt 156 lb (70.8 kg)   LMP 10/15/2022 Comment: pt recently had an abortion x3 weeks ago  SpO2 98%   BMI 29.00 kg/m   Visual Acuity Right Eye Distance:   Left Eye Distance:   Bilateral Distance:    Right Eye Near:   Left Eye Near:    Bilateral Near:     Physical Exam Vitals and nursing note reviewed.  Constitutional:      General: She is not in acute distress.    Appearance: She is well-developed. She is not ill-appearing.  HENT:     Head: Normocephalic and atraumatic.     Right Ear: Tympanic membrane and ear canal normal.     Left Ear: Tympanic membrane and ear canal normal.     Nose: Congestion present.     Right Turbinates: Swollen.     Left Turbinates: Swollen and pale.     Right Sinus: Frontal sinus tenderness present.     Left Sinus: Frontal sinus tenderness present.     Mouth/Throat:     Mouth: Mucous membranes are moist.     Pharynx: Oropharynx is clear. Uvula midline. No oropharyngeal exudate or posterior oropharyngeal erythema.     Tonsils: No tonsillar exudate or tonsillar  abscesses.  Eyes:  Conjunctiva/sclera: Conjunctivae normal.     Pupils: Pupils are equal, round, and reactive to light.  Cardiovascular:     Rate and Rhythm: Normal rate and regular rhythm.     Heart sounds: Normal heart sounds.  Pulmonary:     Effort: Pulmonary effort is normal.     Breath sounds: Normal breath sounds.  Musculoskeletal:     Cervical back: Normal range of motion and neck supple.  Lymphadenopathy:     Cervical: No cervical adenopathy.  Skin:    General: Skin is warm and dry.  Neurological:     General: No focal deficit present.     Mental Status: She is alert and oriented to person, place, and time.  Psychiatric:        Mood and Affect: Mood normal.        Behavior: Behavior normal.      UC Treatments / Results  Labs (all labs ordered are listed, but only abnormal results are displayed) Labs Reviewed - No data to display  EKG   Radiology No results found.  Procedures Procedures (including critical care time)  Medications Ordered in UC Medications - No data to display  Initial Impression / Assessment and Plan / UC Course  I have reviewed the triage vital signs and the nursing notes.  Pertinent labs & imaging results that were available during my care of the patient were reviewed by me and considered in my medical decision making (see chart for details).     Reviewed exam and symptoms with patient.  No red flags on exam. Start Augmentin 875-125 mg twice daily Flonase daily Nasal rinses as tolerated Rest and fluids PCP follow-up 2 to 3 days for recheck ER precautions reviewed and patient verbalized understanding Final Clinical Impressions(s) / UC Diagnoses   Final diagnoses:  Acute frontal sinusitis, recurrence not specified     Discharge Instructions      Augmentin twice daily for 10 days Flonase daily Nasal rinses as tolerated Rest and fluids Follow-up with your PCP 2 to 3 days for recheck Please go to the emergency room if you  have any worsening symptoms   ED Prescriptions     Medication Sig Dispense Auth. Provider   amoxicillin-clavulanate (AUGMENTIN) 875-125 MG tablet Take 1 tablet by mouth every 12 (twelve) hours for 10 days. 20 tablet Melynda Ripple, NP   fluticasone (FLONASE) 50 MCG/ACT nasal spray Place 1 spray into both nostrils daily. 15.8 mL Melynda Ripple, NP      PDMP not reviewed this encounter.   Melynda Ripple, NP 01/05/23 1201
